# Patient Record
Sex: Female | Born: 1965
Health system: Southern US, Community
[De-identification: ages and names within clinical notes are randomized; demographics above are authoritative.]

## PROBLEM LIST (undated history)

## (undated) DIAGNOSIS — K224 Dyskinesia of esophagus: Secondary | ICD-10-CM

## (undated) DIAGNOSIS — K279 Peptic ulcer, site unspecified, unspecified as acute or chronic, without hemorrhage or perforation: Secondary | ICD-10-CM

## (undated) DIAGNOSIS — K449 Diaphragmatic hernia without obstruction or gangrene: Secondary | ICD-10-CM

## (undated) DIAGNOSIS — F329 Major depressive disorder, single episode, unspecified: Secondary | ICD-10-CM

## (undated) DIAGNOSIS — F419 Anxiety disorder, unspecified: Secondary | ICD-10-CM

## (undated) DIAGNOSIS — K222 Esophageal obstruction: Secondary | ICD-10-CM

## (undated) DIAGNOSIS — K644 Residual hemorrhoidal skin tags: Secondary | ICD-10-CM

## (undated) DIAGNOSIS — B009 Herpesviral infection, unspecified: Secondary | ICD-10-CM

## (undated) DIAGNOSIS — Z765 Malingerer [conscious simulation]: Secondary | ICD-10-CM

## (undated) DIAGNOSIS — F32A Depression, unspecified: Secondary | ICD-10-CM

## (undated) DIAGNOSIS — K922 Gastrointestinal hemorrhage, unspecified: Secondary | ICD-10-CM

## (undated) DIAGNOSIS — I10 Essential (primary) hypertension: Secondary | ICD-10-CM

## (undated) HISTORY — DX: Dyskinesia of esophagus: K22.4

## (undated) HISTORY — DX: Peptic ulcer, site unspecified, unspecified as acute or chronic, without hemorrhage or perforation: K27.9

## (undated) HISTORY — DX: Herpesviral infection, unspecified: B00.9

## (undated) HISTORY — DX: Diaphragmatic hernia without obstruction or gangrene: K44.9

## (undated) HISTORY — PX: COLONOSCOPY: SHX174

## (undated) HISTORY — PX: DILATION AND CURETTAGE OF UTERUS: SHX78

## (undated) HISTORY — PX: UPPER GASTROINTESTINAL ENDOSCOPY: SHX188

## (undated) HISTORY — DX: Gastrointestinal hemorrhage, unspecified: K92.2

## (undated) HISTORY — DX: Residual hemorrhoidal skin tags: K64.4

## (undated) HISTORY — DX: Essential (primary) hypertension: I10

## (undated) HISTORY — DX: Esophageal obstruction: K22.2

---

## 2011-08-06 ENCOUNTER — Emergency Department (HOSPITAL_COMMUNITY)
Admission: EM | Admit: 2011-08-06 | Discharge: 2011-08-06 | Disposition: A | Discharge status: Home / Self Care | Payer: 59 | Attending: Emergency Medicine | Admitting: Emergency Medicine

## 2011-08-06 ENCOUNTER — Encounter (HOSPITAL_COMMUNITY): Payer: Self-pay | Admitting: Emergency Medicine

## 2011-08-06 DIAGNOSIS — J3489 Other specified disorders of nose and nasal sinuses: Secondary | ICD-10-CM | POA: Insufficient documentation

## 2011-08-06 DIAGNOSIS — F329 Major depressive disorder, single episode, unspecified: Secondary | ICD-10-CM | POA: Insufficient documentation

## 2011-08-06 DIAGNOSIS — G43909 Migraine, unspecified, not intractable, without status migrainosus: Secondary | ICD-10-CM | POA: Insufficient documentation

## 2011-08-06 DIAGNOSIS — H53149 Visual discomfort, unspecified: Secondary | ICD-10-CM | POA: Insufficient documentation

## 2011-08-06 DIAGNOSIS — F3289 Other specified depressive episodes: Secondary | ICD-10-CM | POA: Insufficient documentation

## 2011-08-06 DIAGNOSIS — R110 Nausea: Secondary | ICD-10-CM | POA: Insufficient documentation

## 2011-08-06 DIAGNOSIS — R5381 Other malaise: Secondary | ICD-10-CM | POA: Insufficient documentation

## 2011-08-06 HISTORY — DX: Major depressive disorder, single episode, unspecified: F32.9

## 2011-08-06 HISTORY — DX: Depression, unspecified: F32.A

## 2011-08-06 MED ORDER — KETOROLAC TROMETHAMINE 60 MG/2ML IM SOLN
60.0000 mg | Freq: Once | INTRAMUSCULAR | Status: AC
  Administered 2011-08-06: 60 mg via INTRAMUSCULAR
  Filled 2011-08-06: qty 2

## 2011-08-06 MED ORDER — BUTORPHANOL TARTRATE 2 MG/ML IJ SOLN
4.0000 mg | Freq: Once | INTRAMUSCULAR | Status: AC
  Administered 2011-08-06: 4 mg via INTRAMUSCULAR
  Filled 2011-08-06: qty 2

## 2011-08-06 MED ORDER — PROMETHAZINE HCL 25 MG/ML IJ SOLN
25.0000 mg | Freq: Once | INTRAMUSCULAR | Status: AC
  Administered 2011-08-06: 25 mg via INTRAMUSCULAR
  Filled 2011-08-06: qty 1

## 2011-08-06 MED ORDER — DEXAMETHASONE SODIUM PHOSPHATE 10 MG/ML IJ SOLN
10.0000 mg | Freq: Once | INTRAMUSCULAR | Status: AC
  Administered 2011-08-06: 10 mg via INTRAMUSCULAR
  Filled 2011-08-06: qty 1

## 2011-08-06 NOTE — ED Notes (Signed)
Alert x4, states that she does have a neurologist in [place].

## 2011-08-06 NOTE — ED Notes (Signed)
Pt. Ambulated to bathroom. After return to room MD at bedside.

## 2011-08-06 NOTE — ED Provider Notes (Signed)
History     CSN: [id number]  Arrival date & time 08/06/11  1506   First MD Initiated Contact with Patient 08/06/11 1655      Chief Complaint  Patient presents with  . Headache    (Consider location/radiation/quality/duration/timing/severity/associated sxs/prior treatment) Patient is a 46 y.o. female presenting with headaches. The history is provided by the patient.  Headache  This is a new problem. The current episode started more than 2 days ago. The problem occurs constantly. The problem has not changed since onset.The headache is associated with nothing. The pain is located in the temporal and bilateral region. The pain is at a severity of 10/10. The pain is moderate. The pain does not radiate. Associated symptoms include malaise/fatigue and nausea. Pertinent negatives include no fever, no chest pressure, no near-syncope, no palpitations, no syncope, no shortness of breath and no vomiting.    Past Medical History  Diagnosis Date  . Migraine   . Depression     History reviewed. No pertinent past surgical history.  No family history on file.  History  Substance Use Topics  . Smoking status: Not on file  . Smokeless tobacco: Not on file  . Alcohol Use: Yes    OB History    Grav Para Term Preterm Abortions TAB SAB Ect Mult Living                  Review of Systems  Constitutional: Positive for malaise/fatigue. Negative for fever and chills.  HENT: Positive for congestion.   Eyes: Positive for photophobia.  Respiratory: Negative for shortness of breath.   Cardiovascular: Negative for chest pain, palpitations, syncope and near-syncope.  Gastrointestinal: Positive for nausea. Negative for vomiting, abdominal pain and diarrhea.  Genitourinary: Negative for dysuria.  Musculoskeletal: Negative for back pain.  Neurological: Positive for headaches. Negative for weakness and numbness.  Hematological: Does not bruise/bleed easily.    Allergies  Compazine  Home  Medications   Current Outpatient Rx  Name Route Sig Dispense Refill  . LAMOTRIGINE 100 MG PO TABS Oral Take 150 mg by mouth 2 (two) times daily.    Marland Kitchen LORAZEPAM 1 MG PO TABS Oral Take 1 mg by mouth every 6 (six) hours as needed.    . SERTRALINE HCL 100 MG PO TABS Oral Take 100 mg by mouth 2 (two) times daily.    Marland Kitchen ZOLPIDEM TARTRATE 10 MG PO TABS Oral Take 10 mg by mouth at bedtime as needed.      BP 136/88  Pulse 88  Temp(Src) 97.6 F (36.4 C) (Oral)  Resp 18  SpO2 99%  Physical Exam  Nursing note and vitals reviewed. Constitutional: She is oriented to person, place, and time. She appears well-developed and well-nourished.  HENT:  Head: Normocephalic and atraumatic.  Mouth/Throat: Oropharynx is clear and moist.  Eyes: Conjunctivae and EOM are normal. Pupils are equal, round, and reactive to light.  Neck: Normal range of motion. Neck supple.  Cardiovascular: Normal rate, regular rhythm and normal heart sounds.   No murmur heard. Pulmonary/Chest: Effort normal and breath sounds normal. She has no wheezes.  Abdominal: Soft. Bowel sounds are normal. There is no tenderness.  Musculoskeletal: Normal range of motion.  Neurological: She is alert and oriented to person, place, and time. No cranial nerve deficit. She exhibits normal muscle tone. Coordination normal.  Skin: Skin is warm. No rash noted.    ED Course  Procedures (including critical care time)  Labs Reviewed - No data to display No results  found.   1. Migraine       MDM    Patient having a typical migraine headache today since Wednesday. Normal home regimen medications not working. Patient stated that Stadol Phenergan and Toradol would be beneficial when she has headaches that do not respond. Medications in ED patient now feeling better. She was also given 10 mg of Decadron IM.        Shelda Jakes, MD 08/06/11 [phone]

## 2011-08-06 NOTE — ED Notes (Signed)
Requesting IM injection for migraine

## 2011-08-06 NOTE — ED Notes (Signed)
Has taken her imitrex, goody powder, use of ice packs without relief for the past 2 days.

## 2011-08-06 NOTE — ED Notes (Signed)
Pt has a migraine headache and states that it is chronic and has imitrex and other things at home but has not worked. She is from [place] and that tordol 60mg , phenegran 50mg  and stadol 4mg  is what helps her and that she needs it IM. Feels achy and has had some emesis today small amount .

## 2011-08-14 ENCOUNTER — Encounter (HOSPITAL_COMMUNITY): Payer: Self-pay | Admitting: *Deleted

## 2011-08-14 ENCOUNTER — Emergency Department (HOSPITAL_COMMUNITY)
Admission: EM | Admit: 2011-08-14 | Discharge: 2011-08-14 | Disposition: A | Discharge status: Home / Self Care | Payer: 59 | Attending: Emergency Medicine | Admitting: Emergency Medicine

## 2011-08-14 DIAGNOSIS — H53149 Visual discomfort, unspecified: Secondary | ICD-10-CM | POA: Insufficient documentation

## 2011-08-14 DIAGNOSIS — R51 Headache: Secondary | ICD-10-CM | POA: Insufficient documentation

## 2011-08-14 DIAGNOSIS — R110 Nausea: Secondary | ICD-10-CM | POA: Insufficient documentation

## 2011-08-14 MED ORDER — IBUPROFEN 800 MG PO TABS
800.0000 mg | ORAL_TABLET | Freq: Once | ORAL | Status: AC
  Administered 2011-08-14: 800 mg via ORAL

## 2011-08-14 MED ORDER — BUTORPHANOL TARTRATE 2 MG/ML IJ SOLN
4.0000 mg | Freq: Once | INTRAMUSCULAR | Status: AC
  Administered 2011-08-14: 4 mg via INTRAMUSCULAR
  Filled 2011-08-14: qty 2

## 2011-08-14 MED ORDER — IBUPROFEN 800 MG PO TABS
ORAL_TABLET | ORAL | Status: AC
Start: 2011-08-14 — End: 2011-08-14
  Administered 2011-08-14: 800 mg via ORAL
  Filled 2011-08-14: qty 1

## 2011-08-14 MED ORDER — KETOROLAC TROMETHAMINE 60 MG/2ML IM SOLN
60.0000 mg | Freq: Once | INTRAMUSCULAR | Status: AC
  Administered 2011-08-14: 60 mg via INTRAMUSCULAR
  Filled 2011-08-14: qty 2

## 2011-08-14 MED ORDER — PROMETHAZINE HCL 25 MG/ML IJ SOLN
25.0000 mg | Freq: Once | INTRAMUSCULAR | Status: AC
  Administered 2011-08-14: 25 mg via INTRAVENOUS
  Filled 2011-08-14: qty 1

## 2011-08-14 NOTE — ED Notes (Signed)
Patient stable upon discharge.  

## 2011-08-14 NOTE — ED Notes (Signed)
Pt states "I see Dr. Daphine Deutscher in [place], my mother kind of got some bad news & I've had the h/a since then, started around noon"

## 2011-08-14 NOTE — ED Provider Notes (Signed)
History     CSN: [id number]  Arrival date & time 08/14/11  1843   First MD Initiated Contact with Patient 08/14/11 1908      Chief Complaint  Patient presents with  . Migraine    (Consider location/radiation/quality/duration/timing/severity/associated sxs/prior treatment) HPI Comments: Patient typical migraine headache that started this morning it got worse throughout the day. Associated with nausea, photophobia and phonophobia. No vomiting, no fever, weakness numbness or tingling. She took Imitrex at home without relief.  Patient is a 46 y.o. female presenting with migraine. The history is provided by the patient.  Migraine This is a new problem. The current episode started 6 to 12 hours ago. The problem occurs constantly. The problem has been gradually worsening. Associated symptoms include headaches. Pertinent negatives include no chest pain, no abdominal pain and no shortness of breath. The symptoms are aggravated by nothing. The symptoms are relieved by nothing. She has tried a cold compress for the symptoms.    Past Medical History  Diagnosis Date  . Depression   . Migraine     History reviewed. No pertinent past surgical history.  No family history on file.  History  Substance Use Topics  . Smoking status: Never Smoker   . Smokeless tobacco: Not on file  . Alcohol Use: Yes     rarely    OB History    Grav Para Term Preterm Abortions TAB SAB Ect Mult Living                  Review of Systems  Constitutional: Negative for fever and activity change.  HENT: Negative for congestion and rhinorrhea.   Eyes: Positive for photophobia.  Respiratory: Negative for cough and shortness of breath.   Cardiovascular: Negative for chest pain.  Gastrointestinal: Positive for nausea. Negative for vomiting and abdominal pain.  Genitourinary: Negative for dysuria and hematuria.  Musculoskeletal: Negative for back pain.  Skin: Negative for rash.  Neurological: Positive for  headaches.    Allergies  Compazine  Home Medications   Current Outpatient Rx  Name Route Sig Dispense Refill  . LAMOTRIGINE 100 MG PO TABS Oral Take 150 mg by mouth 2 (two) times daily.    Marland Kitchen LORAZEPAM 1 MG PO TABS Oral Take 1 mg by mouth every 6 (six) hours as needed.    Marland Kitchen PSEUDOEPHEDRINE-ACETAMINOPHEN 30-500 MG PO TABS Oral Take 1 tablet by mouth every 4 (four) hours as needed.    . SERTRALINE HCL 100 MG PO TABS Oral Take 100 mg by mouth 2 (two) times daily.    Marland Kitchen ZOLPIDEM TARTRATE 10 MG PO TABS Oral Take 10 mg by mouth at bedtime as needed.      BP 130/72  Pulse 80  Temp(Src) 98.2 F (36.8 C) (Oral)  Resp 20  Wt 215 lb (97.523 kg)  SpO2 100%  LMP 07/13/2011  Physical Exam  Constitutional: She is oriented to person, place, and time. She appears well-developed and well-nourished. No distress.  HENT:  Head: Normocephalic and atraumatic.  Mouth/Throat: Oropharynx is clear and moist. No oropharyngeal exudate.  Eyes: EOM are normal. Pupils are equal, round, and reactive to light.  Neck: Normal range of motion. Neck supple.       No meningismus  Cardiovascular: Normal rate, regular rhythm and normal heart sounds.   Pulmonary/Chest: Breath sounds normal. No respiratory distress.  Abdominal: Soft. There is no tenderness. There is no rebound and no guarding.  Musculoskeletal: Normal range of motion. She exhibits no edema and no  tenderness.  Neurological: She is alert and oriented to person, place, and time. No cranial nerve deficit.  Skin: Skin is warm.    ED Course  Procedures (including critical care time)  Labs Reviewed - No data to display No results found.   1. Headache       MDM  Typical migraine headache since this morning. Patient states she usually responds to Stadol, Phenergan Toradol.  Improved after medication.  Will discharge with neurology followup.       Glynn Octave, MD 08/14/11 [phone]

## 2011-09-06 ENCOUNTER — Encounter (HOSPITAL_COMMUNITY): Payer: Self-pay | Admitting: Emergency Medicine

## 2011-09-06 ENCOUNTER — Emergency Department (HOSPITAL_COMMUNITY)
Admission: EM | Admit: 2011-09-06 | Discharge: 2011-09-06 | Discharge status: Against Medical Advice | Payer: 59 | Attending: Emergency Medicine | Admitting: Emergency Medicine

## 2011-09-06 DIAGNOSIS — G43909 Migraine, unspecified, not intractable, without status migrainosus: Secondary | ICD-10-CM | POA: Insufficient documentation

## 2011-09-06 DIAGNOSIS — R51 Headache: Secondary | ICD-10-CM

## 2011-09-06 DIAGNOSIS — Z765 Malingerer [conscious simulation]: Secondary | ICD-10-CM

## 2011-09-06 MED ORDER — DIPHENHYDRAMINE HCL 50 MG/ML IJ SOLN
50.0000 mg | Freq: Once | INTRAMUSCULAR | Status: AC
  Administered 2011-09-06: 50 mg via INTRAMUSCULAR
  Filled 2011-09-06: qty 1

## 2011-09-06 MED ORDER — METOCLOPRAMIDE HCL 5 MG/ML IJ SOLN
10.0000 mg | Freq: Once | INTRAMUSCULAR | Status: AC
  Administered 2011-09-06: 10 mg via INTRAMUSCULAR
  Filled 2011-09-06: qty 2

## 2011-09-06 MED ORDER — KETOROLAC TROMETHAMINE 60 MG/2ML IM SOLN
60.0000 mg | Freq: Once | INTRAMUSCULAR | Status: AC
  Administered 2011-09-06: 60 mg via INTRAMUSCULAR
  Filled 2011-09-06: qty 2

## 2011-09-06 NOTE — ED Provider Notes (Addendum)
History     CSN: [id number]  Arrival date & time 09/06/11  4098   First MD Initiated Contact with Patient 09/06/11 2038      Chief Complaint  Patient presents with  . Migraine    (Consider location/radiation/quality/duration/timing/severity/associated sxs/prior treatment) HPI  This is this patient's third ER visit since January 20 for migraine headache. She states she moved to [place] a few months ago. She relates she was awakened at 2 AM with her usual typical migraine which is in her temples and behind her eyes. She has nausea without vomiting. She states he feels like ice picks constantly turning in her head. She did go to work today. She took Imitrex early in the morning and then again at 10 AM without  improvement. She relates she has a specific cocktail which works which is Stadol, Benadryl, Toradol. When asked where she used to go for treatment she said she would go to [facility] ED because  [facility] not give her her cocktail.  PCP no local  Past Medical History  Diagnosis Date  . Depression   . Migraine     History reviewed. No pertinent past surgical history.  No family history on file.  History  Substance Use Topics  . Smoking status: Never Smoker   . Smokeless tobacco: Not on file  . Alcohol Use: Yes     rarely   employed Lives with mother  OB History    Grav Para Term Preterm Abortions TAB SAB Ect Mult Living                  Review of Systems  All other systems reviewed and are negative.    Allergies  Compazine  Home Medications   Current Outpatient Rx  Name Route Sig Dispense Refill  . LAMOTRIGINE 100 MG PO TABS Oral Take 150 mg by mouth 2 (two) times daily.    Marland Kitchen LORAZEPAM 1 MG PO TABS Oral Take 1 mg by mouth every 6 (six) hours as needed. anxiety    . PSEUDOEPHEDRINE-ACETAMINOPHEN 30-500 MG PO TABS Oral Take 1 tablet by mouth every 4 (four) hours as needed. headache    . SERTRALINE HCL 100 MG PO TABS Oral Take 100 mg by mouth 2  (two) times daily.    . SUMATRIPTAN SUCCINATE 6 MG/0.5ML [place] SOLN Subcutaneous Inject 6 mg into the skin every 2 (two) hours as needed. migraine    . ZOLPIDEM TARTRATE 10 MG PO TABS Oral Take 10 mg by mouth at bedtime.       BP 144/81  Pulse 99  Temp(Src) 98 F (36.7 C) (Oral)  Resp 16  Wt 215 lb (97.523 kg)  SpO2 99%  LMP 07/13/2011  Vital signs normal    Physical Exam  Nursing note and vitals reviewed. Constitutional: She is oriented to person, place, and time. She appears well-developed and well-nourished.  Non-toxic appearance. She does not appear ill. No distress.       Patient in no distress.  HENT:  Head: Normocephalic and atraumatic.  Right Ear: External ear normal.  Left Ear: External ear normal.  Nose: Nose normal. No mucosal edema or rhinorrhea.  Mouth/Throat: Oropharynx is clear and moist and mucous membranes are normal. No dental abscesses or uvula swelling.  Eyes: Conjunctivae and EOM are normal. Pupils are equal, round, and reactive to light.  Neck: Normal range of motion and full passive range of motion without pain. Neck supple.  Cardiovascular: Normal rate, regular rhythm and normal heart sounds.  Exam reveals no gallop and no friction rub.   No murmur heard. Pulmonary/Chest: Effort normal and breath sounds normal. No respiratory distress. She has no wheezes. She has no rhonchi. She has no rales. She exhibits no tenderness and no crepitus.  Abdominal: Soft. Normal appearance and bowel sounds are normal. She exhibits no distension. There is no tenderness. There is no rebound and no guarding.  Musculoskeletal: Normal range of motion. She exhibits no edema and no tenderness.       Moves all extremities well.   Neurological: She is alert and oriented to person, place, and time. She has normal strength. No cranial nerve deficit.  Skin: Skin is warm, dry and intact. No rash noted. No erythema. No pallor.  Psychiatric: She has a normal mood and affect. Her speech is  normal and behavior is normal. Her mood appears not anxious.    ED Course  Procedures (including critical care time)   Reviewed the [facility] controlled substance site shows patient gets Stadol 10 mg per mL nasal spray, she gets 6 filled every month for many months by Dr. Carlton Adam of [facility] in [place], it was last filled on February 11 for 30 day supply. When patient was asked if she ran out of her spray she states that she did when I asked her about the prescription that was filled on the 11th she states her mother must gotten it filled. Patient states she did not use it today, patient also does not want an IV. Patient just wants to get some shots and then go home immediately.    Medications  ketorolac (TORADOL) injection 60 mg (60 mg Intramuscular Given 09/06/11 2221)  diphenhydrAMINE (BENADRYL) injection 50 mg (50 mg Intramuscular Given 09/06/11 2219)  metoCLOPramide (REGLAN) injection 10 mg (10 mg Intramuscular Given 09/06/11 2219)    Diagnoses that have been ruled out:  None  Diagnoses that are still under consideration:  None  Final diagnoses:  Drug-seeking behavior  Headache   Pt left AMA  Devoria Albe, MD, FACEP    MDM          Ward Givens, MD 09/06/11 2313  Ward Givens, MD 09/07/11 1537

## 2011-09-06 NOTE — ED Notes (Signed)
Pt alert, nad, c/o migraine h/a, onset this am, pt states hx of same, resp even unlabored, skin pwd, denies n/v, ambulates to triage, steady gait

## 2011-09-06 NOTE — ED Notes (Signed)
Pt left AMA, did not wait to sign form, pt unsatisfied stating that MD did not give medication requested, pt states pain is not improved, pt did not wait to speak to MD again.

## 2011-09-07 ENCOUNTER — Emergency Department (HOSPITAL_COMMUNITY)
Admission: EM | Admit: 2011-09-07 | Discharge: 2011-09-07 | Disposition: A | Discharge status: Home / Self Care | Payer: 59 | Attending: Emergency Medicine | Admitting: Emergency Medicine

## 2011-09-07 ENCOUNTER — Encounter (HOSPITAL_COMMUNITY): Payer: Self-pay | Admitting: Emergency Medicine

## 2011-09-07 DIAGNOSIS — Z79899 Other long term (current) drug therapy: Secondary | ICD-10-CM | POA: Insufficient documentation

## 2011-09-07 DIAGNOSIS — F1910 Other psychoactive substance abuse, uncomplicated: Secondary | ICD-10-CM | POA: Insufficient documentation

## 2011-09-07 DIAGNOSIS — Z765 Malingerer [conscious simulation]: Secondary | ICD-10-CM

## 2011-09-07 DIAGNOSIS — F3289 Other specified depressive episodes: Secondary | ICD-10-CM | POA: Insufficient documentation

## 2011-09-07 DIAGNOSIS — F329 Major depressive disorder, single episode, unspecified: Secondary | ICD-10-CM | POA: Insufficient documentation

## 2011-09-07 NOTE — ED Notes (Signed)
Seen in ED last night . Pain unresolved. Vomited at 1030am

## 2011-09-07 NOTE — Discharge Instructions (Signed)
You need to take your nasal stadol that you already have for your migraines. I have talked to Dr Daphine Deutscher, he states you can go to his office to get IV therapy or get our usual migraine headache non-narcotic therapy here in our ED.

## 2011-09-07 NOTE — ED Provider Notes (Signed)
History     CSN: [id number]  Arrival date & time 09/07/11  1510   First MD Initiated Contact with Patient 09/07/11 1537      Chief Complaint  Patient presents with  . Migraine    pt reports pain in front of head, nausea x 36hrs    (Consider location/radiation/quality/duration/timing/severity/associated sxs/prior treatment) HPI  I saw patient in the ER last night. This is her first ER visit since January 20th for migraine headache. Last night she refused to take any IV medicine and only wanted to get Stadol 4 mg IM. I gave her alternative medicines, which are our usual migraine cocktails and she ended up leaving AMA. She relates she still has her headache and presents to the ED in her work clothes. She has pain in her temples and her forehead and behind her eyes. She relates she has had nausea and vomiting twice. She denies blurred vision.  Neurologist Dr Carlton Adam, [facility] in [facility]  Past Medical History  Diagnosis Date  . Depression   . Migraine     History reviewed. No pertinent past surgical history.  Family History  Problem Relation Age of Onset  . Hypertension Father     History  Substance Use Topics  . Smoking status: Never Smoker   . Smokeless tobacco: Not on file  . Alcohol Use: Yes     rarely  employed  OB History    Grav Para Term Preterm Abortions TAB SAB Ect Mult Living                  Review of Systems  All other systems reviewed and are negative.    Allergies  Benadryl and Compazine  Home Medications   Current Outpatient Rx  Name Route Sig Dispense Refill  . BUTORPHANOL TARTRATE 10 MG/ML [place] SOLN Nasal Place 1 spray into the nose every 4 (four) hours as needed.    Marland Kitchen LAMOTRIGINE 100 MG PO TABS Oral Take 150 mg by mouth 2 (two) times daily.    Marland Kitchen LORAZEPAM 1 MG PO TABS Oral Take 1 mg by mouth every 6 (six) hours as needed. anxiety    . PSEUDOEPHEDRINE-ACETAMINOPHEN 30-500 MG PO TABS Oral Take 1 tablet by mouth every 4 (four) hours as  needed. headache    . SERTRALINE HCL 100 MG PO TABS Oral Take 100 mg by mouth 2 (two) times daily.    . SUMATRIPTAN SUCCINATE 6 MG/0.5ML [place] SOLN Subcutaneous Inject 6 mg into the skin every 2 (two) hours as needed. migraine    . ZOLPIDEM TARTRATE 10 MG PO TABS Oral Take 10 mg by mouth at bedtime.       BP 139/69  Pulse 106  Temp(Src) 98.7 F (37.1 C) (Oral)  Resp 20  SpO2 100%  LMP 07/13/2011  Vital signs normal    Physical Exam  Nursing note and vitals reviewed. Constitutional: She is oriented to person, place, and time. She appears well-developed and well-nourished.  Non-toxic appearance. She does not appear ill. No distress.  HENT:  Head: Normocephalic and atraumatic.  Right Ear: External ear normal.  Left Ear: External ear normal.  Nose: Nose normal. No mucosal edema or rhinorrhea.  Mouth/Throat: Oropharynx is clear and moist and mucous membranes are normal. No dental abscesses or uvula swelling.  Eyes: Conjunctivae and EOM are normal. Pupils are equal, round, and reactive to light.  Neck: Normal range of motion and full passive range of motion without pain. Neck supple.  Cardiovascular: Normal rate,  regular rhythm and normal heart sounds.  Exam reveals no gallop and no friction rub.   No murmur heard. Pulmonary/Chest: Effort normal and breath sounds normal. No respiratory distress. She has no wheezes. She has no rhonchi. She has no rales. She exhibits no tenderness and no crepitus.  Abdominal: Soft. Normal appearance and bowel sounds are normal. She exhibits no distension. There is no tenderness. There is no rebound and no guarding.  Musculoskeletal: Normal range of motion. She exhibits no edema and no tenderness.       Moves all extremities well.   Neurological: She is alert and oriented to person, place, and time. She has normal strength. No cranial nerve deficit.  Skin: Skin is warm, dry and intact. No rash noted. No erythema. No pallor.  Psychiatric: She has a normal mood  and affect. Her speech is normal and behavior is normal. Her mood appears not anxious.    ED Course  Procedures (including critical care time)  15:49 Dr Daphine Deutscher, [facility] states they have an IV room in his office and he has told his migraine headache patients to not come to the ED but to come to the office to get IV steroid, depacon/keppra, toradol and phenergan. He confirms she received Stadol NS on 2/11 and she should have enough to get through 30 days.  He states to give our usual non-narcotic cocktail for headaches.   I have talked to this patient about my conversation with her neurologist. She is still refusing to have an IV and is still insisting on getting her Stadol IM, although she states she has stadol nasal spray at home. Of note patient has not listed the stadol NS on her prior ED visits, I found it when I looked at the [facility] site. She also told me last night that she didn't have any, then when confronted with it showing she had it filled on 2/11 she stated "Oh, my mother must have gotten it filled".   17:15 Dr Jean Rosenthal on call for Dr Konrad Felix (called by patient), states they do not prescribe narcotics after hours and he is not in the position to override anything I discussed with Dr Daphine Deutscher, he states to advise her to call Dr Daphine Deutscher early in the morning to discuss her treatment plan.    1. Drug-seeking behavior    Plan discharge  Devoria Albe, MD, FACEP  MDM          Ward Givens, MD 09/07/11 [phone]

## 2011-10-16 ENCOUNTER — Emergency Department (HOSPITAL_COMMUNITY)
Admission: EM | Admit: 2011-10-16 | Discharge: 2011-10-16 | Disposition: A | Discharge status: Home / Self Care | Payer: Self-pay | Attending: Emergency Medicine | Admitting: Emergency Medicine

## 2011-10-16 ENCOUNTER — Encounter (HOSPITAL_COMMUNITY): Payer: Self-pay | Admitting: *Deleted

## 2011-10-16 DIAGNOSIS — F329 Major depressive disorder, single episode, unspecified: Secondary | ICD-10-CM | POA: Insufficient documentation

## 2011-10-16 DIAGNOSIS — F3289 Other specified depressive episodes: Secondary | ICD-10-CM | POA: Insufficient documentation

## 2011-10-16 DIAGNOSIS — M79609 Pain in unspecified limb: Secondary | ICD-10-CM | POA: Insufficient documentation

## 2011-10-16 DIAGNOSIS — M722 Plantar fascial fibromatosis: Secondary | ICD-10-CM | POA: Insufficient documentation

## 2011-10-16 DIAGNOSIS — G43909 Migraine, unspecified, not intractable, without status migrainosus: Secondary | ICD-10-CM | POA: Insufficient documentation

## 2011-10-16 DIAGNOSIS — R110 Nausea: Secondary | ICD-10-CM | POA: Insufficient documentation

## 2011-10-16 DIAGNOSIS — Z79899 Other long term (current) drug therapy: Secondary | ICD-10-CM | POA: Insufficient documentation

## 2011-10-16 MED ORDER — BUTORPHANOL TARTRATE 1 MG/ML IJ SOLN
2.0000 mg | Freq: Once | INTRAMUSCULAR | Status: AC
  Administered 2011-10-16: 2 mg via INTRAMUSCULAR
  Filled 2011-10-16: qty 1

## 2011-10-16 MED ORDER — PROMETHAZINE HCL 25 MG/ML IJ SOLN
25.0000 mg | Freq: Once | INTRAMUSCULAR | Status: AC
  Administered 2011-10-16: 25 mg via INTRAMUSCULAR
  Filled 2011-10-16: qty 1

## 2011-10-16 MED ORDER — KETOROLAC TROMETHAMINE 30 MG/ML IJ SOLN: 30.0000 mg | Freq: Once | INTRAMUSCULAR | Status: DC

## 2011-10-16 MED ORDER — KETOROLAC TROMETHAMINE 30 MG/ML IJ SOLN
30.0000 mg | Freq: Once | INTRAMUSCULAR | Status: AC
  Administered 2011-10-16: 30 mg via INTRAMUSCULAR
  Filled 2011-10-16: qty 1

## 2011-10-16 MED ORDER — PROMETHAZINE HCL 25 MG/ML IJ SOLN
25.0000 mg | Freq: Once | INTRAMUSCULAR | Status: DC
  Filled 2011-10-16: qty 1

## 2011-10-16 NOTE — ED Notes (Signed)
Pt states "the headache started yesterday, I took a couple of Goody's, I'm out of my stadol, I don't get that again until next week, I also have pain in my feet, I worked out and don't know if I over did it"

## 2011-10-16 NOTE — Discharge Instructions (Signed)
Migraine Headache  A migraine is very bad pain on one or both sides of your head. The cause of a migraine is not always known. A migraine can be triggered or caused by different things, such as:   Alcohol.   Smoking.   Stress.   Periods (menstruation) in women.   Aged cheeses.   Foods or drinks that contain nitrates, glutamate, aspartame, or tyramine.   Lack of sleep.   Chocolate.   Caffeine.   Hunger.   Medicines, such as nitroglycerine (used to treat chest pain), birth control pills, estrogen, and some blood pressure medicines.  HOME CARE   Many medicines can help migraine pain or keep migraines from coming back. Your doctor can help you decide on a medicine or treatment program.   If you or your child gets a migraine, it may help to lie down in a dark, quiet room.   Keep a headache journal. This may help find out what is causing the headaches. For example, write down:   What you eat and drink.   How much sleep you get.   Any change to your diet or medicines.  GET HELP RIGHT AWAY IF:    The medicine does not work.   The pain begins again.   The neck is stiff.   You have trouble seeing.   The muscles are weak or you lose muscle control.   You have new symptoms.   You lose your balance.   You have trouble walking.   You feel faint or pass out.  MAKE SURE YOU:    Understand these instructions.   Will watch this condition.   Will get help right away if you are not doing well or get worse.  Document Released: 04/11/2008 Document Revised: 06/22/2011 Document Reviewed: 03/08/2009  [employer] Patient Information 2012 [employer]

## 2011-10-16 NOTE — ED Provider Notes (Signed)
History     CSN: [id number]  Arrival date & time 10/16/11  1447   First MD Initiated Contact with Patient 10/16/11 1728      Chief Complaint  Patient presents with  . Migraine  . Foot Pain    (Consider location/radiation/quality/duration/timing/severity/associated sxs/prior treatment) HPI Comments: Patient has a history of Migraine headaches comes in today with a headache.  She reports that the headache today is no different than her typical migraine.  She has tried taking Goody's Powder for the pain, but has not had relief.  She reports that her migraines are typically triggered by her menstrual cycle and that she is currently having her period.  Patient also reports that she has been having pain of her left plantar fascia.  She reports that she has had this pain for the past 4 days.  Four days ago she worked out for the first time in over a month.  Pain worse with walking.  Patient is a 46 y.o. female presenting with migraine and lower extremity pain. The history is provided by the patient.  Migraine This is a new problem. The current episode started yesterday. The problem occurs constantly. The problem has been gradually worsening. Associated symptoms include headaches and nausea. Pertinent negatives include no chills, fever, neck pain, numbness, rash, visual change, vomiting or weakness.  Foot Pain Associated symptoms include headaches and nausea. Pertinent negatives include no chills, fever, neck pain, numbness, rash, visual change, vomiting or weakness.    Past Medical History  Diagnosis Date  . Depression   . Migraine     History reviewed. No pertinent past surgical history.  Family History  Problem Relation Age of Onset  . Hypertension Father     History  Substance Use Topics  . Smoking status: Never Smoker   . Smokeless tobacco: Not on file  . Alcohol Use: Yes     rarely    OB History    Grav Para Term Preterm Abortions TAB SAB Ect Mult Living                  Review of Systems  Constitutional: Negative for fever and chills.  HENT: Negative for neck pain and neck stiffness.   Gastrointestinal: Positive for nausea. Negative for vomiting.  Skin: Negative for rash.  Neurological: Positive for headaches. Negative for dizziness, syncope, speech difficulty, weakness, light-headedness and numbness.  Psychiatric/Behavioral: Negative for confusion.    Allergies  Benadryl and Compazine  Home Medications   Current Outpatient Rx  Name Route Sig Dispense Refill  . BUTORPHANOL TARTRATE 10 MG/ML NA SOLN Nasal Place 1 spray into the nose every 4 (four) hours as needed. pain    . LAMOTRIGINE 100 MG PO TABS Oral Take 150 mg by mouth 2 (two) times daily. 1 and 1/2 tablet twice daily    . LORAZEPAM 1 MG PO TABS Oral Take 1 mg by mouth every 6 (six) hours as needed. anxiety    . PSEUDOEPHEDRINE-ACETAMINOPHEN 30-500 MG PO TABS Oral Take 1 tablet by mouth every 4 (four) hours as needed. headache    . SERTRALINE HCL 100 MG PO TABS Oral Take 100 mg by mouth 2 (two) times daily.    Marland Kitchen ZOLPIDEM TARTRATE 10 MG PO TABS Oral Take 10 mg by mouth at bedtime.       BP 118/50  Pulse 97  Temp(Src) 99.1 F (37.3 C) (Oral)  Resp 16  Wt 212 lb (96.163 kg)  SpO2 96%  LMP 10/12/2011  Physical Exam  Nursing note and vitals reviewed. Constitutional: She is oriented to person, place, and time. She appears well-developed and well-nourished. No distress.  HENT:  Head: Normocephalic and atraumatic.  Right Ear: External ear normal.  Left Ear: External ear normal.  Eyes: Conjunctivae and EOM are normal. Pupils are equal, round, and reactive to light. Right eye exhibits no discharge. Left eye exhibits no discharge. Right conjunctiva is not injected. Right conjunctiva has no hemorrhage. Left conjunctiva is not injected. Left conjunctiva has no hemorrhage. No scleral icterus. Right eye exhibits no nystagmus. Left eye exhibits no nystagmus.  Neck: Normal range of motion and  full passive range of motion without pain. Neck supple. No spinous process tenderness present. No rigidity.  Cardiovascular: Normal rate, regular rhythm, normal heart sounds and intact distal pulses.   Pulmonary/Chest: Effort normal and breath sounds normal. No respiratory distress.  Musculoskeletal: Normal range of motion.       Tenderness to palpation of the left plantar fascia.    Neurological: She is alert and oriented to person, place, and time. She has normal strength. No cranial nerve deficit or sensory deficit. Coordination and gait normal.  Skin: Skin is warm and dry. No rash noted. She is not diaphoretic.    ED Course  Procedures (including critical care time)  Labs Reviewed - No data to display No results found.   1. Migraine headache    Patient looked up in the [facility] Narcotic Database.  She does not have any recent prescriptions filled for narcotics.   Patient reports that her headache is improving.  MDM  Pt HA treated and improved while in ED.  Presentation is like pts typical HA and non concerning for Operating Room Services, ICH, Meningitis, or temporal arteritis. Pt is afebrile with no focal neuro deficits, nuchal rigidity, or change in vision. Pt is to follow up with her Neurologist to discuss prophylactic medication. Pt verbalizes understanding and is agreeable with plan to dc. Patient was not given Rx for Stadol.  She was told that she would need to see her Neurologist for this.        Pascal Lux [place], PA-C 10/17/11 0006

## 2011-10-17 ENCOUNTER — Emergency Department (HOSPITAL_COMMUNITY)
Admission: EM | Admit: 2011-10-17 | Discharge: 2011-10-17 | Disposition: A | Discharge status: Home / Self Care | Payer: Self-pay | Attending: Emergency Medicine | Admitting: Emergency Medicine

## 2011-10-17 ENCOUNTER — Encounter (HOSPITAL_COMMUNITY): Payer: Self-pay | Admitting: *Deleted

## 2011-10-17 DIAGNOSIS — G43909 Migraine, unspecified, not intractable, without status migrainosus: Secondary | ICD-10-CM | POA: Insufficient documentation

## 2011-10-17 MED ORDER — PROMETHAZINE HCL 25 MG/ML IJ SOLN
25.0000 mg | Freq: Four times a day (QID) | INTRAMUSCULAR | Status: DC | PRN
  Administered 2011-10-17: 25 mg via INTRAMUSCULAR
  Filled 2011-10-17: qty 1

## 2011-10-17 MED ORDER — KETOROLAC TROMETHAMINE 30 MG/ML IJ SOLN
30.0000 mg | Freq: Once | INTRAMUSCULAR | Status: AC
  Administered 2011-10-17: 30 mg via INTRAMUSCULAR
  Filled 2011-10-17: qty 1

## 2011-10-17 MED ORDER — SODIUM CHLORIDE 0.9 % IV SOLN: Freq: Once | INTRAVENOUS | Status: DC

## 2011-10-17 MED ORDER — BUTORPHANOL TARTRATE 1 MG/ML IJ SOLN
4.0000 mg | Freq: Once | INTRAMUSCULAR | Status: AC
  Administered 2011-10-17: 4 mg via INTRAVENOUS
  Filled 2011-10-17: qty 2

## 2011-10-17 NOTE — ED Notes (Signed)
Pt alert and oriented x4. Respirations even and unlabored, bilateral symmetrical rise and fall of chest. Skin warm and dry. In no acute distress. Denies needs.  Denies itching or rash. Ambulatory to discharge window.

## 2011-10-17 NOTE — Discharge Instructions (Signed)
It is extremely important that you followup with your neurologist today via telephone.  Please be sure to call to ensure appropriate ongoing care and appropriate medication use.

## 2011-10-17 NOTE — ED Notes (Signed)
Medications administered. Pt will wait in room for 15 min before being discharged. Pt called family member to give her a ride home.

## 2011-10-17 NOTE — ED Notes (Signed)
Pt alert and oriented x4. Respirations even and unlabored, bilateral symmetrical rise and fall of chest. Skin warm and dry. In no acute distress. Denies needs.   

## 2011-10-17 NOTE — ED Provider Notes (Signed)
History     CSN: [id number]  Arrival date & time 10/17/11  8295   First MD Initiated Contact with Patient 10/17/11 [phone]      Chief Complaint  Patient presents with  . Migraine    (Consider location/radiation/quality/duration/timing/severity/associated sxs/prior treatment) HPI Patient presents with headache.  She notes that this headache began approximately 4 days ago.  The headache has been persistent since that time, in spite of Goody powder.  She has a history of migraines, nose as this headache is characteristically "the same".  Notably, the patient presented to this facility within the past 16 hours for this complaint.  She was treated with IM medications.  She notes that she went home, and her headache persisted.  She slept a few hours, but awoke with continued complaints.  She did not take any additional medication at home.  She presents for additional evaluation of this persistent headache.  She notes no new visual changes, vomiting, ataxia, confusion, or other focal complaints. Past Medical History  Diagnosis Date  . Depression   . Migraine     History reviewed. No pertinent past surgical history.  Family History  Problem Relation Age of Onset  . Hypertension Father     History  Substance Use Topics  . Smoking status: Never Smoker   . Smokeless tobacco: Not on file  . Alcohol Use: Yes     rarely    OB History    Grav Para Term Preterm Abortions TAB SAB Ect Mult Living                  Review of Systems  Constitutional: Negative for fever and chills.  HENT: Negative for neck pain and neck stiffness.   Eyes: Negative.   Respiratory: Negative.   Cardiovascular: Negative.   Gastrointestinal: Positive for nausea. Negative for vomiting.  Genitourinary: Negative.   Musculoskeletal:       Foot pain which the patient notes is better today  Skin: Negative for rash.  Neurological: Positive for headaches. Negative for dizziness, syncope, speech difficulty, weakness,  light-headedness and numbness.  Psychiatric/Behavioral: Negative for confusion.    Allergies  Benadryl and Compazine  Home Medications   Current Outpatient Rx  Name Route Sig Dispense Refill  . BUTORPHANOL TARTRATE 10 MG/ML NA SOLN Nasal Place 1 spray into the nose every 4 (four) hours as needed. pain    . LAMOTRIGINE 100 MG PO TABS Oral Take 150 mg by mouth 2 (two) times daily. 1 and 1/2 tablet twice daily    . LORAZEPAM 1 MG PO TABS Oral Take 1 mg by mouth every 6 (six) hours as needed. anxiety    . PSEUDOEPHEDRINE-ACETAMINOPHEN 30-500 MG PO TABS Oral Take 1 tablet by mouth every 4 (four) hours as needed. headache    . SERTRALINE HCL 100 MG PO TABS Oral Take 100 mg by mouth 2 (two) times daily.    Marland Kitchen ZOLPIDEM TARTRATE 10 MG PO TABS Oral Take 10 mg by mouth at bedtime.       BP 138/90  Pulse 105  Temp(Src) 98.6 F (37 C) (Oral)  Resp 30  Ht 5\' 7"  (1.702 m)  Wt 212 lb (96.163 kg)  BMI 33.20 kg/m2  SpO2 100%  LMP 10/12/2011  Physical Exam  Nursing note and vitals reviewed. Constitutional: She is oriented to person, place, and time. She appears well-developed and well-nourished. No distress.  HENT:  Head: Normocephalic and atraumatic.  Eyes: Conjunctivae and EOM are normal.  Cardiovascular: Normal rate and  regular rhythm.   Pulmonary/Chest: Effort normal and breath sounds normal. No stridor. No respiratory distress.  Abdominal: She exhibits no distension.  Musculoskeletal: She exhibits no edema.  Neurological: She is alert and oriented to person, place, and time. No cranial nerve deficit. She exhibits normal muscle tone. Coordination normal.  Skin: Skin is warm and dry.  Psychiatric: She has a normal mood and affect.    ED Course  Procedures (including critical care time)  Labs Reviewed - No data to display No results found.   No diagnosis found.    MDM  This patient with migraine headaches now presents 12 hours after being discharged with continued headache.  On  my exam the patient is in no distress with no focal neurologic deficits.  The patient's endorsement of this I think characteristically "the same" as innumerable prior headaches it is reassuring.  The absence of abnormal vital signs aside from mild tachycardia and tachypnea on triage (which is not demonstrated on my physical exam) is further reassurance for the absence of acute new pathology.  I discussed, at length, and the need for continued neurology followup, the need for better home medication use for migraine control, and the fact that we would not provide any new narcotic prescriptions for this patient.  She was treated with intramuscular medication and discharged in stable condition.     Gerhard Munch, MD 10/17/11 [phone]

## 2011-10-17 NOTE — ED Provider Notes (Signed)
Medical screening examination/treatment/procedure(s) were performed by non-physician practitioner and as supervising physician I was immediately available for consultation/collaboration.  Hurman Horn, MD 10/17/11 2116

## 2011-10-17 NOTE — ED Notes (Signed)
Pt states she has frequent migraines.pt states she needs all 3 shots at one time. Pt states her headache decreased but is still there. Pt denies any n/v/d

## 2011-11-03 ENCOUNTER — Emergency Department (HOSPITAL_COMMUNITY)
Admission: EM | Admit: 2011-11-03 | Discharge: 2011-11-03 | Disposition: A | Discharge status: Home Health | Payer: Self-pay | Attending: Emergency Medicine | Admitting: Emergency Medicine

## 2011-11-03 ENCOUNTER — Encounter (HOSPITAL_COMMUNITY): Payer: Self-pay | Admitting: Adult Health

## 2011-11-03 DIAGNOSIS — G43909 Migraine, unspecified, not intractable, without status migrainosus: Secondary | ICD-10-CM | POA: Insufficient documentation

## 2011-11-03 DIAGNOSIS — R51 Headache: Secondary | ICD-10-CM

## 2011-11-03 DIAGNOSIS — R110 Nausea: Secondary | ICD-10-CM | POA: Insufficient documentation

## 2011-11-03 MED ORDER — BUTORPHANOL TARTRATE 2 MG/ML IJ SOLN
4.0000 mg | Freq: Once | INTRAMUSCULAR | Status: AC
  Administered 2011-11-03: 4 mg via INTRAMUSCULAR
  Filled 2011-11-03 (×2): qty 1

## 2011-11-03 MED ORDER — METOCLOPRAMIDE HCL 5 MG/ML IJ SOLN
10.0000 mg | Freq: Once | INTRAMUSCULAR | Status: AC
  Administered 2011-11-03: 10 mg via INTRAMUSCULAR
  Filled 2011-11-03: qty 2

## 2011-11-03 MED ORDER — KETOROLAC TROMETHAMINE 60 MG/2ML IM SOLN
60.0000 mg | Freq: Once | INTRAMUSCULAR | Status: AC
  Administered 2011-11-03: 60 mg via INTRAMUSCULAR
  Filled 2011-11-03: qty 2

## 2011-11-03 NOTE — ED Provider Notes (Signed)
History     CSN: [id number]  Arrival date & time 11/03/11  1209   First MD Initiated Contact with Patient 11/03/11 1220      No chief complaint on file.   (Consider location/radiation/quality/duration/timing/severity/associated sxs/prior treatment) HPI Comments: 46 yo female with history of migraine, depression presents to the ER this afternoon for her "usual migraines."  Migraine pain woke the patient yesterday early in the morning from sleep.  She used ice packs and went back to bed, waking in the morning with mild temporal pain and pain behind the eyes.  Pain progressed in severity throughout the day.  Reports sensitivity to sound and light. Patient has tried goody powder and stadol nasal spray, which did not alleviate the symptoms.  Reports nausea, anorexia, no vomiting.  Denies dizziness, vision changes, weakness, blurred vision, ataxia. Denies fever or neck pain.  Patient is a 46 y.o. female presenting with migraine. The history is provided by the patient.  Migraine This is a new problem. The current episode started yesterday. Associated symptoms include headaches. Pertinent negatives include no fatigue, fever, nausea, neck pain, numbness, vomiting or weakness.    Past Medical History  Diagnosis Date  . Depression   . Migraine     History reviewed. No pertinent past surgical history.  Family History  Problem Relation Age of Onset  . Hypertension Father     History  Substance Use Topics  . Smoking status: Never Smoker   . Smokeless tobacco: Not on file  . Alcohol Use: Yes     rarely    OB History    Grav Para Term Preterm Abortions TAB SAB Ect Mult Living                  Review of Systems  Constitutional: Negative for fever and fatigue.  HENT: Negative for facial swelling, neck pain, neck stiffness and tinnitus.   Eyes: Positive for photophobia. Negative for pain and visual disturbance.  Gastrointestinal: Negative for nausea and vomiting.  Neurological:  Positive for headaches. Negative for dizziness, weakness, light-headedness and numbness.  Psychiatric/Behavioral: Negative for confusion.    Allergies  Benadryl and Compazine  Home Medications   Current Outpatient Rx  Name Route Sig Dispense Refill  . BUTORPHANOL TARTRATE 10 MG/ML NA SOLN Nasal Place 1 spray into the nose every 4 (four) hours as needed. pain    . LAMOTRIGINE 100 MG PO TABS Oral Take 150 mg by mouth 2 (two) times daily. 1 and 1/2 tablet twice daily    . LORAZEPAM 1 MG PO TABS Oral Take 1 mg by mouth every 6 (six) hours as needed. anxiety    . PSEUDOEPHEDRINE-ACETAMINOPHEN 30-500 MG PO TABS Oral Take 1 tablet by mouth every 4 (four) hours as needed. headache    . SERTRALINE HCL 100 MG PO TABS Oral Take 100 mg by mouth 2 (two) times daily.    Marland Kitchen ZOLPIDEM TARTRATE 10 MG PO TABS Oral Take 10 mg by mouth at bedtime.       BP 169/85  Pulse 109  Temp(Src) 98.2 F (36.8 C) (Oral)  Resp 16  SpO2 99%  LMP 10/12/2011  Physical Exam  Nursing note and vitals reviewed. Constitutional: She is oriented to person, place, and time. She appears well-developed and well-nourished. No distress.  HENT:  Head: Normocephalic and atraumatic.  Right Ear: External ear normal.  Left Ear: External ear normal.  Nose: Nose normal.  Mouth/Throat: Uvula is midline, oropharynx is clear and moist and mucous membranes are  normal.  Eyes: Conjunctivae are normal. Pupils are equal, round, and reactive to light.  Neck: Normal range of motion. Neck supple.  Cardiovascular: Normal rate, regular rhythm and normal heart sounds.   Pulmonary/Chest: Effort normal and breath sounds normal. No respiratory distress.  Musculoskeletal: She exhibits no edema and no tenderness.  Neurological: She is alert and oriented to person, place, and time. She has normal strength. No cranial nerve deficit or sensory deficit. Coordination and gait normal. GCS eye subscore is 4. GCS verbal subscore is 5. GCS motor subscore is  6.  Skin: Skin is warm and dry.  Psychiatric: She has a normal mood and affect.    ED Course  Procedures (including critical care time)  Labs Reviewed - No data to display No results found.   1. Headache     1:08 PM Patient seen and examined. Medications ordered.   Vital signs reviewed and are as follows: Filed Vitals:   11/03/11 1214  BP: 169/85  Pulse: 109  Temp: 98.2 F (36.8 C)  Resp: 16   Pt urged to return home and rest. Told to follow-up with her neurologist. Renae Gloss to return with worsening or other concerns.    MDM  Typical HA. Patient requesting 'typical' cocktail for HA. Concern for patient requesting narcotics for HA, one dose given in ED. No concern for meningitis, other serious intracranial etiology. Normal neurological exam. Patient appeared well ambulating in hallway without difficulty.         Renne Crigler, [place] 11/03/11 1315

## 2011-11-03 NOTE — ED Notes (Signed)
C/o migraine that began last night took stadol nose spray with no relief, sensitive to light and sound, c/o nausea.

## 2011-11-03 NOTE — Discharge Instructions (Signed)
Please read and follow all provided instructions.  Your diagnoses today include:  1. Headache     Tests performed today include:  Vital signs. See below for your results today.   Medications:  In the Emergency Department you received:  Reglan - antinausea/headache medication  Stadol  Toradol  You have been prescribed:  None  Additional information:  Follow any educational materials contained in this packet.  You are having a headache. No specific cause was found today for your headache. It may have been a migraine or other cause of headache. Stress, anxiety, fatigue, and depression are common triggers for headaches.  Your headache today does not appear to be life-threatening or require hospitalization, but often the exact cause of headaches is not determined in the emergency department. Therefore, follow-up with your doctor is very important to find out what may have caused your headache and whether or not you need any further diagnostic testing or treatment.   Sometimes headaches can appear benign (not harmful), but then more serious symptoms can develop which should prompt an immediate re-evaluation by your doctor or the emergency department.  BE VERY CAREFUL not to take multiple medicines containing Tylenol (also called acetaminophen). Doing so can lead to an overdose which can damage your liver and cause liver failure and possibly death.   Follow-up instructions: Please follow-up with your primary care provider in the next 3 days for further evaluation of your symptoms. If you do not have a primary care doctor -- see below for referral information.   Return instructions:   Please return to the Emergency Department if you experience worsening symptoms.  Return if the medications do not resolve your headache, if it recurs, or if you have multiple episodes of vomiting or cannot keep down fluids.  Return if you have a change from the usual headache.  RETURN IMMEDIATELY IF  you:  Develop a sudden, severe headache  Develop confusion or become poorly responsive or faint  Develop a fever above 100.57F or problem breathing  Have a change in speech, vision, swallowing, or understanding  Develop new weakness, numbness, tingling, incoordination in your arms or legs  Have a seizure  Please return if you have any other emergent concerns.  Additional Information:  Your vital signs today were: BP 169/85  Pulse 109  Temp(Src) 98.2 F (36.8 C) (Oral)  Resp 16  SpO2 99%  LMP 10/12/2011 If your blood pressure (BP) was elevated above 135/85 this visit, please have this repeated by your doctor within one month. -------------- No Primary Care Doctor Call [facility]  [phone] Other agencies that provide inexpensive medical care    [facility]  [phone]    [facility]  [phone]    [facility]  [phone]    [facility]  [phone]    [facility]  [phone]    [facility]  [phone] -------------- RESOURCE GUIDE:  Dental Problems  Patients with Medicaid: [facility] [id number]                                            [phone] Phone:  [phone]  Phone:  [phone]  If unable to pay or uninsured, contact:  [facility] or [facility]. to become qualified for the adult dental clinic.  Chronic Pain Problems Contact [facility]  [phone] Patients need to be referred by their primary care doctor.  Insufficient Money for Medicine Contact [employer]:  call "211" or [facility] [phone].  Psychological Services [facility]  [phone] [facility]  [phone] [facility] Mental [facility]   [phone] (emergency services 9252384582)  Substance Abuse Resources [facility]  (484)343-1425 [facility] [phone] [facility] [place] [phone] Floydene Flock [phone] Residential & Outpatient Substance Abuse Program  325-838-0314  Abuse/Neglect [facility] Child Abuse Hotline 412-610-3200 [facility] Child Abuse Hotline 6156099782 (After Hours)  Emergency Shelter [facility] ([phone]  Maternity Homes Room at the [place] [facility] ([phone] [place] [facility] [phone]  [facility] Resources  [facility] [place]     [employer]                          [facility] Dept. [address] [place]                       [address]      [address]  Blondell Reveal Phone:  [phone]                                   Phone:  [phone]                 Phone:  [phone]  [facility] Mental Health Phone:  [phone]  [facility] Child Abuse Hotline (514)223-1622 ([phone] (After Hours)

## 2011-11-03 NOTE — ED Provider Notes (Signed)
Medical screening examination/treatment/procedure(s) were performed by non-physician practitioner and as supervising physician I was immediately available for consultation/collaboration.   Celene Kras, MD 11/03/11 1324

## 2011-11-17 ENCOUNTER — Encounter (HOSPITAL_COMMUNITY): Payer: Self-pay | Admitting: Emergency Medicine

## 2011-11-17 ENCOUNTER — Emergency Department (HOSPITAL_COMMUNITY)
Admission: EM | Admit: 2011-11-17 | Discharge: 2011-11-17 | Disposition: A | Discharge status: Home / Self Care | Payer: Self-pay | Attending: Emergency Medicine | Admitting: Emergency Medicine

## 2011-11-17 DIAGNOSIS — G43909 Migraine, unspecified, not intractable, without status migrainosus: Secondary | ICD-10-CM | POA: Insufficient documentation

## 2011-11-17 MED ORDER — BUTORPHANOL TARTRATE 2 MG/ML IJ SOLN
4.0000 mg | Freq: Once | INTRAMUSCULAR | Status: AC
  Administered 2011-11-17: 4 mg via INTRAVENOUS

## 2011-11-17 MED ORDER — BUTORPHANOL TARTRATE 2 MG/ML IJ SOLN
2.0000 mg | Freq: Once | INTRAMUSCULAR | Status: DC
  Filled 2011-11-17 (×2): qty 1

## 2011-11-17 MED ORDER — PROMETHAZINE HCL 25 MG/ML IJ SOLN
25.0000 mg | Freq: Once | INTRAMUSCULAR | Status: AC
  Administered 2011-11-17: 25 mg via INTRAMUSCULAR
  Filled 2011-11-17 (×2): qty 1

## 2011-11-17 MED ORDER — KETOROLAC TROMETHAMINE 60 MG/2ML IM SOLN
60.0000 mg | Freq: Once | INTRAMUSCULAR | Status: AC
  Administered 2011-11-17: 60 mg via INTRAMUSCULAR
  Filled 2011-11-17: qty 2

## 2011-11-17 NOTE — ED Provider Notes (Signed)
History     CSN: [id number]  Arrival date & time 11/17/11  1606   First MD Initiated Contact with Patient 11/17/11 1718      Chief Complaint  Patient presents with  . Migraine    (Consider location/radiation/quality/duration/timing/severity/associated sxs/prior treatment) HPI Patient presents to the emergency department for migraine headache.  She states it is typical for her migraines and there is no alteration from her normal symptomatology.  Patient states that she has had some nausea some photophobia.  She denies numbness, weakness, dizziness, difficulty walking or visual changes.  Patient states that she normally takes 3 medications and an IM injection here in the emergency department. Past Medical History  Diagnosis Date  . Depression   . Migraine     History reviewed. No pertinent past surgical history.  Family History  Problem Relation Age of Onset  . Hypertension Father     History  Substance Use Topics  . Smoking status: Never Smoker   . Smokeless tobacco: Not on file  . Alcohol Use: No     rarely    OB History    Grav Para Term Preterm Abortions TAB SAB Ect Mult Living                  Review of Systems All other systems negative except as documented in the HPI. All pertinent positives and negatives as reviewed in the HPI.  Allergies  Benadryl and Compazine  Home Medications   Current Outpatient Rx  Name Route Sig Dispense Refill  . BUTORPHANOL TARTRATE 10 MG/ML NA SOLN Nasal Place 1 spray into the nose every 4 (four) hours as needed. pain    . LAMOTRIGINE 100 MG PO TABS Oral Take 150 mg by mouth 2 (two) times daily. 1 and 1/2 tablet twice daily    . LORAZEPAM 1 MG PO TABS Oral Take 1 mg by mouth every 6 (six) hours as needed. anxiety    . SERTRALINE HCL 100 MG PO TABS Oral Take 100 mg by mouth 2 (two) times daily.    Marland Kitchen ZOLPIDEM TARTRATE 10 MG PO TABS Oral Take 10 mg by mouth at bedtime.       BP 142/90  Pulse 108  Temp(Src) 98.6 F (37 C)  (Oral)  Resp 18  Wt 210 lb (95.255 kg)  SpO2 99%  Physical Exam Physical Examination: General appearance - alert, well appearing, and in no distress and oriented to person, place, and time Mental status - alert, oriented to person, place, and time, normal mood, behavior, speech, dress, motor activity, and thought processes Eyes - pupils equal and reactive, extraocular eye movements intact Ears - bilateral TM's and external ear canals normal Nose - normal and patent, no erythema, discharge or polyps Mouth - mucous membranes moist, pharynx normal without lesions Chest - clear to auscultation, no wheezes, rales or rhonchi, symmetric air entry Heart - normal rate, regular rhythm, normal S1, S2, no murmurs, rubs, clicks or gallops Neurological - alert, oriented, normal speech, no focal findings or movement disorder noted, motor and sensory grossly normal bilaterally, normal muscle tone, no tremors, strength 5/5  ED Course  Procedures (including critical care time)  Patient's previous visits were reviewed.  She is not having focal neurological deficits and is feeling better at this time.  She will be discharged home told to return if any worsening in her condition.  MDM          Carlyle Dolly, PA-C 11/17/11 1920

## 2011-11-17 NOTE — Discharge Instructions (Signed)
Return here as needed.  Follow-up with your neurologist °

## 2011-11-17 NOTE — ED Provider Notes (Signed)
Medical screening examination/treatment/procedure(s) were performed by non-physician practitioner and as supervising physician I was immediately available for consultation/collaboration.  Raeford Razor, MD 11/17/11 [phone]

## 2011-11-19 ENCOUNTER — Emergency Department (HOSPITAL_COMMUNITY)
Admission: EM | Admit: 2011-11-19 | Discharge: 2011-11-19 | Disposition: A | Discharge status: Home / Self Care | Payer: Self-pay | Attending: Emergency Medicine | Admitting: Emergency Medicine

## 2011-11-19 ENCOUNTER — Encounter (HOSPITAL_COMMUNITY): Payer: Self-pay

## 2011-11-19 DIAGNOSIS — R6883 Chills (without fever): Secondary | ICD-10-CM | POA: Insufficient documentation

## 2011-11-19 DIAGNOSIS — G43909 Migraine, unspecified, not intractable, without status migrainosus: Secondary | ICD-10-CM | POA: Insufficient documentation

## 2011-11-19 MED ORDER — KETOROLAC TROMETHAMINE 60 MG/2ML IM SOLN
60.0000 mg | Freq: Once | INTRAMUSCULAR | Status: AC
  Administered 2011-11-19: 60 mg via INTRAMUSCULAR
  Filled 2011-11-19: qty 2

## 2011-11-19 MED ORDER — BUTORPHANOL TARTRATE 2 MG/ML IJ SOLN
4.0000 mg | Freq: Once | INTRAMUSCULAR | Status: DC
  Filled 2011-11-19: qty 2

## 2011-11-19 MED ORDER — PROMETHAZINE HCL 25 MG/ML IJ SOLN
25.0000 mg | Freq: Once | INTRAMUSCULAR | Status: AC
  Administered 2011-11-19: 25 mg via INTRAMUSCULAR
  Filled 2011-11-19: qty 1

## 2011-11-19 MED ORDER — BUTORPHANOL TARTRATE 2 MG/ML IJ SOLN
4.0000 mg | Freq: Once | INTRAMUSCULAR | Status: AC
  Administered 2011-11-19: 4 mg via INTRAMUSCULAR

## 2011-11-19 NOTE — ED Notes (Addendum)
Pt states that she was seen in ED on Friday for migraine. Given medicine to relieve the migraine. Ran/walk in the cancer cure race yesterday and got sick, head spinning, and got progressively worse. Pt stated that "her head felt like it was going to explode". Pt experiencing sensitivity to light, smells, and noises, pt states that everything is maginified.

## 2011-11-19 NOTE — ED Provider Notes (Signed)
Medical screening examination/treatment/procedure(s) were performed by non-physician practitioner and as supervising physician I was immediately available for consultation/collaboration.   Celene Kras, MD 11/19/11 1340

## 2011-11-19 NOTE — ED Provider Notes (Signed)
History     CSN: [id number]  Arrival date & time 11/19/11  1205   First MD Initiated Contact with Patient 11/19/11 1209      Chief Complaint  Patient presents with  . Migraine    (Consider location/radiation/quality/duration/timing/severity/associated sxs/prior treatment) HPI Comments: 46 yo female with a history of migraines and depression presents today for migraine since Saturday afternoon.  Reports "usual type migraine" that has not been relieved by goody powder, muscle relaxants, ice packs, sleep.  Has "run out of my stadol and I'll get it again next week."  Reports sensitivities to sound and light, nausea. Denies weakness, dizziness, numbness, vision changes.  Was seen Friday for migraine.  Requesting usual 3 medication headache cocktail including stadol, Toradol, and phenergan. Denies neck stiffness or pain. No fever.   Patient is a 46 y.o. female presenting with migraine. The history is provided by the patient.  Migraine This is a recurrent problem. The current episode started yesterday. The problem has been gradually worsening. Associated symptoms include chills, headaches and nausea. Pertinent negatives include no abdominal pain, fatigue, fever, neck pain, numbness, vomiting or weakness. Exacerbated by: light, sound. Treatments tried: Goody powder. The treatment provided mild relief.    Past Medical History  Diagnosis Date  . Depression   . Migraine     No past surgical history on file.  Family History  Problem Relation Age of Onset  . Hypertension Father     History  Substance Use Topics  . Smoking status: Never Smoker   . Smokeless tobacco: Not on file  . Alcohol Use: No     rarely    OB History    Grav Para Term Preterm Abortions TAB SAB Ect Mult Living                  Review of Systems  Constitutional: Positive for chills. Negative for fever and fatigue.  HENT: Negative for rhinorrhea, neck pain, neck stiffness and sinus pressure.   Eyes: Negative for  visual disturbance.  Gastrointestinal: Positive for nausea. Negative for vomiting, abdominal pain and diarrhea.  Neurological: Positive for headaches. Negative for dizziness, syncope, facial asymmetry, speech difficulty, weakness and numbness.    Allergies  Benadryl and Compazine  Home Medications   Current Outpatient Rx  Name Route Sig Dispense Refill  . BUTORPHANOL TARTRATE 10 MG/ML NA SOLN Nasal Place 1 spray into the nose every 4 (four) hours as needed. pain    . LAMOTRIGINE 100 MG PO TABS Oral Take 150 mg by mouth 2 (two) times daily. 1 and 1/2 tablet twice daily    . LORAZEPAM 1 MG PO TABS Oral Take 1 mg by mouth every 6 (six) hours as needed. anxiety    . SERTRALINE HCL 100 MG PO TABS Oral Take 100 mg by mouth 2 (two) times daily.    Marland Kitchen ZOLPIDEM TARTRATE 10 MG PO TABS Oral Take 10 mg by mouth at bedtime.       There were no vitals taken for this visit.  Physical Exam  Nursing note and vitals reviewed. Constitutional: She appears well-developed and well-nourished. No distress.  HENT:  Head: Normocephalic and atraumatic.  Right Ear: Tympanic membrane, external ear and ear canal normal.  Left Ear: Tympanic membrane, external ear and ear canal normal.  Nose: Right sinus exhibits no maxillary sinus tenderness and no frontal sinus tenderness. Left sinus exhibits no maxillary sinus tenderness and no frontal sinus tenderness.  Mouth/Throat: Uvula is midline, oropharynx is clear and moist and  mucous membranes are normal. No dental abscesses.  Eyes: Conjunctivae are normal. Pupils are equal, round, and reactive to light. Right eye exhibits no discharge. Left eye exhibits no discharge.  Neck: Normal range of motion. Neck supple.  Cardiovascular: Normal rate, regular rhythm and normal heart sounds.   Pulmonary/Chest: Effort normal and breath sounds normal.  Abdominal: Soft. There is no tenderness.  Musculoskeletal:       No tenderness upon palpation of the cervical vertebrae, neck,  shoulders.  Neurological: She is alert. She has normal strength. No cranial nerve deficit or sensory deficit. Coordination and gait normal.       5/5 strength in bilateral extremities.  Skin: Skin is warm and dry.  Psychiatric: She has a normal mood and affect.    ED Course  Procedures (including critical care time)  Labs Reviewed - No data to display No results found.   1. Migraine     12:58 PM Patient seen and examined. Medications ordered.   Vital signs reviewed and are as follows: Filed Vitals:   11/19/11 1221  BP: 136/99  Pulse: 128  Temp: 99.1 F (37.3 C)  Resp: 16   Urged follow-up with neurologist. Renae Gloss return with worsening. She verbalizes understanding and agrees with plan.    MDM  Pt with h/o migraines, typical symptoms. No concern for meningitis, head injury. Patient appears well. Normal neurological exam.         Renne Crigler, PA 11/19/11 1335

## 2011-12-10 ENCOUNTER — Encounter (HOSPITAL_COMMUNITY): Payer: Self-pay | Admitting: *Deleted

## 2011-12-10 ENCOUNTER — Emergency Department (HOSPITAL_COMMUNITY)
Admission: EM | Admit: 2011-12-10 | Discharge: 2011-12-10 | Disposition: A | Discharge status: Home / Self Care | Payer: Self-pay | Attending: Emergency Medicine | Admitting: Emergency Medicine

## 2011-12-10 DIAGNOSIS — R000 Tachycardia, unspecified: Secondary | ICD-10-CM | POA: Insufficient documentation

## 2011-12-10 DIAGNOSIS — G43909 Migraine, unspecified, not intractable, without status migrainosus: Secondary | ICD-10-CM | POA: Insufficient documentation

## 2011-12-10 MED ORDER — BUTORPHANOL TARTRATE 2 MG/ML IJ SOLN
4.0000 mg | Freq: Once | INTRAMUSCULAR | Status: AC
  Administered 2011-12-10: 4 mg via INTRAMUSCULAR
  Filled 2011-12-10: qty 2

## 2011-12-10 MED ORDER — KETOROLAC TROMETHAMINE 60 MG/2ML IM SOLN
60.0000 mg | Freq: Once | INTRAMUSCULAR | Status: AC
Start: 2011-12-10 — End: 2011-12-10
  Administered 2011-12-10: 60 mg via INTRAMUSCULAR
  Filled 2011-12-10: qty 2

## 2011-12-10 MED ORDER — PROMETHAZINE HCL 25 MG/ML IJ SOLN
25.0000 mg | INTRAMUSCULAR | Status: AC
  Administered 2011-12-10: 25 mg via INTRAMUSCULAR
  Filled 2011-12-10: qty 1

## 2011-12-10 NOTE — ED Provider Notes (Signed)
History     CSN: [id number]  Arrival date & time 12/10/11  1531   First MD Initiated Contact with Patient 12/10/11 1644      Chief Complaint  Patient presents with  . Migraine  . Photophobia  . Nausea    (Consider location/radiation/quality/duration/timing/severity/associated sxs/prior treatment) HPI  46 year old female with history of migraine presents with chief complaint of headache. Patient reports she is having her usual migraine.  Onset gradual, persistent and unrelieved with home treatment.  Headache started since last night.  Described as pain to the side of head behind the left eye. Endorse light and sound sensitivities. She is nauseated without vomiting or diarrhea. Denies fever, neck stiffness, chest pain, shortness of breath, numbness, weakness. Denies any precipitating factors. Patient has multiple prior visits to ER for same.  Sts she has been seen by her neurologist, Dr. Daphine Deutscher who recommend trying botox as possible future treatment.    Past Medical History  Diagnosis Date  . Depression   . Migraine     History reviewed. No pertinent past surgical history.  Family History  Problem Relation Age of Onset  . Hypertension Father   . Cancer Mother     History  Substance Use Topics  . Smoking status: Never Smoker   . Smokeless tobacco: Never Used  . Alcohol Use: Yes     rarely    OB History    Grav Para Term Preterm Abortions TAB SAB Ect Mult Living                  Review of Systems  All other systems reviewed and are negative.    Allergies  Benadryl and Compazine  Home Medications   Current Outpatient Rx  Name Route Sig Dispense Refill  . BUTORPHANOL TARTRATE 10 MG/ML NA SOLN Nasal Place 1 spray into the nose every 4 (four) hours as needed. pain    . LAMOTRIGINE 100 MG PO TABS Oral Take 150 mg by mouth 2 (two) times daily.     Marland Kitchen LORAZEPAM 1 MG PO TABS Oral Take 1 mg by mouth every 6 (six) hours as needed. anxiety    . PSEUDOEPHEDRINE HCL 30  MG PO TABS Oral Take 30 mg by mouth every 4 (four) hours as needed. SINUS    . SERTRALINE HCL 100 MG PO TABS Oral Take 100 mg by mouth 2 (two) times daily.    Marland Kitchen ZOLPIDEM TARTRATE 10 MG PO TABS Oral Take 10 mg by mouth at bedtime.       BP 148/82  Pulse 116  Temp(Src) 98.8 F (37.1 C) (Oral)  Resp 18  Ht 5\' 7"  (1.702 m)  Wt 209 lb 10.5 oz (95.1 kg)  BMI 32.84 kg/m2  SpO2 99%  LMP 11/13/2011  Physical Exam  Nursing note and vitals reviewed. Constitutional: She is oriented to person, place, and time. She appears well-developed and well-nourished. No distress.  HENT:  Head: Normocephalic and atraumatic.  Eyes: Conjunctivae and EOM are normal. Pupils are equal, round, and reactive to light.  Neck: Normal range of motion. Neck supple. No Brudzinski's sign and no Kernig's sign noted.  Cardiovascular: Tachycardia present.   Pulmonary/Chest: Effort normal. No respiratory distress. She has no wheezes.  Abdominal: Soft. There is no tenderness.  Musculoskeletal: Normal range of motion.  Lymphadenopathy:    She has no cervical adenopathy.  Neurological: She is alert and oriented to person, place, and time.  Skin: Skin is warm. No rash noted.  Psychiatric: She has a  normal mood and affect.    ED Course  Procedures (including critical care time)  Labs Reviewed - No data to display No results found.   No diagnosis found.    MDM  Migraine headache, similar to past.  No focal neuro deficits, no menigismal sign.  Is A&O, nontoxic.  Will treat with Stadol/Toradol/phenergan.  Patient is mildly tachycardic, secondary to pain. Patient states is normal for her. She is afebrile.   5:41 PM Pt felt better after treatment.  Will d/c.  Recommendation to f/u with neurologist, Dr. Daphine Deutscher.  Pt voice understanding and agrees.   Fayrene Helper, PA-C 12/10/11 1742

## 2011-12-10 NOTE — Discharge Instructions (Signed)
Migraine Headache  A migraine is very bad pain on one or both sides of your head. The cause of a migraine is not always known. A migraine can be triggered or caused by different things, such as:   Alcohol.   Smoking.   Stress.   Periods (menstruation) in women.   Aged cheeses.   Foods or drinks that contain nitrates, glutamate, aspartame, or tyramine.   Lack of sleep.   Chocolate.   Caffeine.   Hunger.   Medicines, such as nitroglycerine (used to treat chest pain), birth control pills, estrogen, and some blood pressure medicines.  HOME CARE   Many medicines can help migraine pain or keep migraines from coming back. Your doctor can help you decide on a medicine or treatment program.   If you or your child gets a migraine, it may help to lie down in a dark, quiet room.   Keep a headache journal. This may help find out what is causing the headaches. For example, write down:   What you eat and drink.   How much sleep you get.   Any change to your diet or medicines.  GET HELP RIGHT AWAY IF:    The medicine does not work.   The pain begins again.   The neck is stiff.   You have trouble seeing.   The muscles are weak or you lose muscle control.   You have new symptoms.   You lose your balance.   You have trouble walking.   You feel faint or pass out.  MAKE SURE YOU:    Understand these instructions.   Will watch this condition.   Will get help right away if you are not doing well or get worse.  Document Released: 04/11/2008 Document Revised: 06/22/2011 Document Reviewed: 03/08/2009  [employer] Patient Information 2012 [employer]

## 2011-12-10 NOTE — ED Notes (Signed)
Pt from home with reports of pain on left side of head and behind left eye, reports that pain is typical for migraines which she has been diagnosed with. Pt also endorses photophobia and nausea.

## 2011-12-16 NOTE — ED Provider Notes (Signed)
Medical screening examination/treatment/procedure(s) were performed by non-physician practitioner and as supervising physician I was immediately available for consultation/collaboration.  Raeford Razor, MD 12/16/11 [phone]

## 2012-01-06 ENCOUNTER — Emergency Department (HOSPITAL_COMMUNITY)
Admission: EM | Admit: 2012-01-06 | Discharge: 2012-01-06 | Disposition: A | Discharge status: Home / Self Care | Payer: Self-pay | Attending: Emergency Medicine | Admitting: Emergency Medicine

## 2012-01-06 ENCOUNTER — Encounter (HOSPITAL_COMMUNITY): Payer: Self-pay | Admitting: *Deleted

## 2012-01-06 DIAGNOSIS — G43709 Chronic migraine without aura, not intractable, without status migrainosus: Secondary | ICD-10-CM

## 2012-01-06 DIAGNOSIS — G43909 Migraine, unspecified, not intractable, without status migrainosus: Secondary | ICD-10-CM | POA: Insufficient documentation

## 2012-01-06 MED ORDER — PROMETHAZINE HCL 25 MG/ML IJ SOLN
25.0000 mg | Freq: Once | INTRAMUSCULAR | Status: AC
  Administered 2012-01-06: 25 mg via INTRAMUSCULAR
  Filled 2012-01-06: qty 1

## 2012-01-06 MED ORDER — KETOROLAC TROMETHAMINE 60 MG/2ML IM SOLN
60.0000 mg | Freq: Once | INTRAMUSCULAR | Status: AC
  Administered 2012-01-06: 60 mg via INTRAMUSCULAR
  Filled 2012-01-06: qty 2

## 2012-01-06 MED ORDER — BUTORPHANOL TARTRATE 1 MG/ML IJ SOLN
1.0000 mg | Freq: Once | INTRAMUSCULAR | Status: AC
  Administered 2012-01-06: 1 mg via INTRAMUSCULAR
  Filled 2012-01-06: qty 1

## 2012-01-06 NOTE — ED Provider Notes (Signed)
History     CSN: [id number]  Arrival date & time 01/06/12  2002   First MD Initiated Contact with Patient 01/06/12 2152     10:38 PM HPI  Patient is a 46 y.o. female presenting with migraine. The history is provided by the patient.  Migraine This is a recurrent problem. Episode onset: 2 days ago. The problem occurs constantly. The problem has been gradually worsening. Associated symptoms include headaches and nausea. Pertinent negatives include no chills, congestion, coughing, fever, neck pain, numbness, sore throat, vomiting or weakness. Exacerbated by: Bright light. She has tried NSAIDs, sleep and rest for the symptoms. The treatment provided mild relief.    Past Medical History  Diagnosis Date  . Depression   . Migraine     No past surgical history on file.  Family History  Problem Relation Age of Onset  . Hypertension Father   . Cancer Mother     History  Substance Use Topics  . Smoking status: Never Smoker   . Smokeless tobacco: Never Used  . Alcohol Use: Yes     rarely    OB History    Grav Para Term Preterm Abortions TAB SAB Ect Mult Living                  Review of Systems  Constitutional: Negative for fever and chills.  HENT: Negative for congestion, sore throat, rhinorrhea, trouble swallowing, neck pain, neck stiffness, postnasal drip and sinus pressure.   Respiratory: Negative for cough and shortness of breath.   Cardiovascular: Negative for palpitations.  Gastrointestinal: Positive for nausea. Negative for vomiting.  Musculoskeletal: Negative for back pain.  Neurological: Positive for headaches. Negative for dizziness, seizures, speech difficulty, weakness, light-headedness and numbness.  All other systems reviewed and are negative.    Allergies  Benadryl and Compazine  Home Medications   Current Outpatient Rx  Name Route Sig Dispense Refill  . BUTORPHANOL TARTRATE 10 MG/ML NA SOLN Nasal Place 1 spray into the nose every 4 (four) hours as  needed. pain    . LAMOTRIGINE 100 MG PO TABS Oral Take 150 mg by mouth 2 (two) times daily.     Marland Kitchen LORAZEPAM 1 MG PO TABS Oral Take 1 mg by mouth every 6 (six) hours as needed. anxiety    . PSEUDOEPHEDRINE HCL 30 MG PO TABS Oral Take 30 mg by mouth every 4 (four) hours as needed. SINUS    . SERTRALINE HCL 100 MG PO TABS Oral Take 100 mg by mouth 2 (two) times daily.    Marland Kitchen ZOLPIDEM TARTRATE 10 MG PO TABS Oral Take 10 mg by mouth at bedtime.       BP 150/83  Pulse 131  Temp 98.8 F (37.1 C) (Oral)  Resp 20  SpO2 99%  Physical Exam  Vitals reviewed. Constitutional: She is oriented to person, place, and time. Vital signs are normal. She appears well-developed and well-nourished.  HENT:  Head: Normocephalic and atraumatic.  Right Ear: External ear normal.  Left Ear: External ear normal.  Nose: Nose normal.  Mouth/Throat: Oropharynx is clear and moist. No oropharyngeal exudate.  Eyes: Conjunctivae and EOM are normal. Pupils are equal, round, and reactive to light. Right eye exhibits no discharge. Left eye exhibits no discharge.  Neck: Normal range of motion. Neck supple.  Cardiovascular: Normal rate, regular rhythm and normal heart sounds.  Exam reveals no friction rub.   No murmur heard. Pulmonary/Chest: Effort normal and breath sounds normal. She has no wheezes. She  has no rhonchi. She has no rales. She exhibits no tenderness.  Musculoskeletal: Normal range of motion.  Lymphadenopathy:    She has no cervical adenopathy.  Neurological: She is alert and oriented to person, place, and time. No cranial nerve deficit (tested cranial nerves III-XII). Coordination (normal finger to nose and normal ambulation.) normal.  Skin: Skin is warm and dry. No rash noted. No erythema. No pallor.    ED Course  Procedures   MDM  Patient has been here several times for same complaints. Reports she Stadol, Toradol, Phenergan which she calls a migraine cocktail. States the only medication that has worked.  Reports increasing visits since January of 2013th. Discussed with patient that I have concerns she is becoming dependent on the Stadol in the emergency department. I have given IM Stadol, Toradol and Phenergan. However I recommend patient discuss her frequent visits with her neurologist. Discussed that narcotic medication relieves it can also cause migraine headaches. Patient voices understanding and is ready for discharge  Edema suspicion for acute headache is patient states his typical migraine that is in the front of her head. Reports pain as throbbing and associated with mild nausea. No midline neck tenderness or fever.       Thomasene Lot, PA-C 01/06/12 2238

## 2012-01-06 NOTE — Discharge Instructions (Signed)
Recurrent Migraine Headache You have a recurrent migraine headache. The caregiver can usually provide good relief for this headache. If this headache is the same as your previous migraine headaches, it is safe to treat you without repeating a complete evaluation.   These headaches usually have at least two of the following problems:   They occur on one side of the head, pulsate, and are severe enough to prevent daily activities.   They are aggravated by daily physical activities.  You may have one or more of the following symptoms:   Nausea (feeling sick to your stomach).   Vomiting.   Pain with exposure to bright lights or loud noises.  Most headache sufferers have a family history of migraines. Your headaches may also be related to alcohol and smoking habits. Too much sleep, too little sleep, mood, and anxiety may also play a part. Changing some of these triggers may help you lower the number and level of pain of the headaches. Headaches may be related to menses (female menstruation). There are numerous medications that can prevent these headaches. Your caregiver can help you with a medication or regimen (procedure to follow). If this has been a chronic (long-term) condition, the use of long-term narcotics is not recommended. Using long-term narcotics can cause recurrent migraines. Narcotics are only a temporary measure only. They are used for the infrequent migraine that fails to respond to all other measures. SEEK MEDICAL CARE IF:    You do not get relief from the medications given to you.   You have a recurrence of pain.   This headache begins to differ from past migraine (for example if it is more severe).  SEEK IMMEDIATE MEDICAL CARE IF:  You have a fever.   You have a stiff neck.   You have vision loss or have changes in vision.   You have problems with feeling lightheaded, become faint, or lose your balance.   You have muscular weakness.   You have loss of muscular  control.   You develop severe symptoms different from your first symptoms.   You start losing your balance or have trouble walking.   You feel faint or pass out.  MAKE SURE YOU:    Understand these instructions.   Will watch your condition.   Will get help right away if you are not doing well or get worse.  Document Released: 03/28/2001 Document Revised: 06/22/2011 Document Reviewed: 02/20/2008 [employer] Patient Information 2012 [employer]. 

## 2012-01-07 ENCOUNTER — Emergency Department (HOSPITAL_COMMUNITY)
Admission: EM | Admit: 2012-01-07 | Discharge: 2012-01-07 | Disposition: A | Discharge status: Home / Self Care | Payer: Self-pay | Attending: Emergency Medicine | Admitting: Emergency Medicine

## 2012-01-07 ENCOUNTER — Encounter (HOSPITAL_COMMUNITY): Payer: Self-pay

## 2012-01-07 DIAGNOSIS — G43909 Migraine, unspecified, not intractable, without status migrainosus: Secondary | ICD-10-CM | POA: Insufficient documentation

## 2012-01-07 MED ORDER — PROMETHAZINE HCL 25 MG/ML IJ SOLN
25.0000 mg | Freq: Four times a day (QID) | INTRAMUSCULAR | Status: DC | PRN
  Administered 2012-01-07: 25 mg via INTRAMUSCULAR
  Filled 2012-01-07: qty 1

## 2012-01-07 MED ORDER — KETOROLAC TROMETHAMINE 60 MG/2ML IM SOLN
60.0000 mg | Freq: Once | INTRAMUSCULAR | Status: AC
  Administered 2012-01-07: 60 mg via INTRAMUSCULAR
  Filled 2012-01-07: qty 2

## 2012-01-07 MED ORDER — BUTORPHANOL TARTRATE 2 MG/ML IJ SOLN
4.0000 mg | Freq: Once | INTRAMUSCULAR | Status: AC
  Administered 2012-01-07: 4 mg via INTRAMUSCULAR
  Filled 2012-01-07 (×2): qty 1

## 2012-01-07 NOTE — ED Provider Notes (Signed)
Medical screening examination/treatment/procedure(s) were performed by non-physician practitioner and as supervising physician I was immediately available for consultation/collaboration.   Lyanne Co, MD 01/07/12 [phone]

## 2012-01-07 NOTE — ED Notes (Signed)
C/o migraine exacerbation x 2 days, no relief from home remedies, nausea, no vomiting.

## 2012-01-07 NOTE — ED Provider Notes (Signed)
History     CSN: [id number]  Arrival date & time 01/07/12  1813   6:45 PM HPI Pt was seen by me yesterday for chronic migraine. Reports her migraine began 3 days ago. States medications given yesterday were incorrect dosage and her headache is still here. Reports no worsening in symptoms just a persistent headache.  Patient is a 46 y.o. female presenting with headaches. The history is provided by the patient.  Headache  This is a new problem. The current episode started more than 2 days ago. The problem occurs constantly. The problem has not changed since onset.The pain is located in the frontal region. The quality of the pain is described as throbbing. The pain is severe. The pain does not radiate. Associated symptoms include nausea and vomiting. Pertinent negatives include no fever, no palpitations and no shortness of breath. The treatment provided no relief.    Past Medical History  Diagnosis Date  . Depression   . Migraine     No past surgical history on file.  Family History  Problem Relation Age of Onset  . Hypertension Father   . Cancer Mother     History  Substance Use Topics  . Smoking status: Never Smoker   . Smokeless tobacco: Never Used  . Alcohol Use: Yes     rarely    OB History    Grav Para Term Preterm Abortions TAB SAB Ect Mult Living                  Review of Systems  Constitutional: Negative for fever and chills.  HENT: Negative for congestion, sore throat, rhinorrhea, trouble swallowing, neck pain, neck stiffness, postnasal drip and sinus pressure.   Respiratory: Negative for cough and shortness of breath.   Cardiovascular: Negative for palpitations.  Gastrointestinal: Positive for nausea and vomiting.  Musculoskeletal: Negative for back pain.  Neurological: Positive for headaches. Negative for dizziness, seizures, speech difficulty, weakness, light-headedness and numbness.  All other systems reviewed and are negative.    Allergies  Benadryl  and Compazine  Home Medications   Current Outpatient Rx  Name Route Sig Dispense Refill  . BUTORPHANOL TARTRATE 10 MG/ML NA SOLN Nasal Place 1 spray into the nose every 4 (four) hours as needed. pain    . LAMOTRIGINE 100 MG PO TABS Oral Take 150 mg by mouth 2 (two) times daily.     Marland Kitchen LORAZEPAM 1 MG PO TABS Oral Take 1 mg by mouth every 6 (six) hours as needed. anxiety    . PSEUDOEPHEDRINE HCL 30 MG PO TABS Oral Take 30 mg by mouth every 4 (four) hours as needed. SINUS    . SERTRALINE HCL 100 MG PO TABS Oral Take 100 mg by mouth 2 (two) times daily.    Marland Kitchen ZOLPIDEM TARTRATE 10 MG PO TABS Oral Take 10 mg by mouth at bedtime.       BP 135/86  Pulse 105  Temp 99 F (37.2 C) (Oral)  Resp 18  SpO2 100%  Physical Exam  Vitals reviewed. Constitutional: She is oriented to person, place, and time. Vital signs are normal. She appears well-developed and well-nourished.  HENT:  Head: Normocephalic and atraumatic.  Right Ear: External ear normal.  Left Ear: External ear normal.  Nose: Nose normal.  Mouth/Throat: Oropharynx is clear and moist. No oropharyngeal exudate.  Eyes: Conjunctivae and EOM are normal. Pupils are equal, round, and reactive to light. Right eye exhibits no discharge. Left eye exhibits no discharge.  Neck: Normal  range of motion. Neck supple. No spinous process tenderness and no muscular tenderness present. No rigidity. Normal range of motion present.  Cardiovascular: Normal rate, regular rhythm and normal heart sounds.  Exam reveals no friction rub.   No murmur heard. Pulmonary/Chest: Effort normal and breath sounds normal. She has no wheezes. She has no rhonchi. She has no rales. She exhibits no tenderness.  Musculoskeletal: Normal range of motion.  Lymphadenopathy:    She has no cervical adenopathy.  Neurological: She is alert and oriented to person, place, and time. No cranial nerve deficit (tested CN III-XII). Coordination (Normal gait and finger to nose) normal.  Skin:  Skin is warm and dry. No rash noted. No erythema. No pallor.    ED Course  Procedures  MDM  Pt has received her medication and request discharge so she may sleep in her own bed. Once again I advised patient to f/u with her neurologist. Pt voices understanding and is ready for d/c       Thomasene Lot, PA-C 01/07/12 1906

## 2012-01-07 NOTE — ED Provider Notes (Signed)
Medical screening examination/treatment/procedure(s) were performed by non-physician practitioner and as supervising physician I was immediately available for consultation/collaboration.  Flint Melter, MD 01/07/12 0010

## 2012-01-14 ENCOUNTER — Encounter (HOSPITAL_COMMUNITY): Payer: Self-pay | Admitting: Emergency Medicine

## 2012-01-14 ENCOUNTER — Emergency Department (HOSPITAL_COMMUNITY)
Admission: EM | Admit: 2012-01-14 | Discharge: 2012-01-14 | Disposition: A | Discharge status: Home / Self Care | Payer: Self-pay | Attending: Emergency Medicine | Admitting: Emergency Medicine

## 2012-01-14 DIAGNOSIS — R110 Nausea: Secondary | ICD-10-CM | POA: Insufficient documentation

## 2012-01-14 DIAGNOSIS — R109 Unspecified abdominal pain: Secondary | ICD-10-CM

## 2012-01-14 DIAGNOSIS — N949 Unspecified condition associated with female genital organs and menstrual cycle: Secondary | ICD-10-CM | POA: Insufficient documentation

## 2012-01-14 DIAGNOSIS — Z79899 Other long term (current) drug therapy: Secondary | ICD-10-CM | POA: Insufficient documentation

## 2012-01-14 DIAGNOSIS — F329 Major depressive disorder, single episode, unspecified: Secondary | ICD-10-CM | POA: Insufficient documentation

## 2012-01-14 DIAGNOSIS — F3289 Other specified depressive episodes: Secondary | ICD-10-CM | POA: Insufficient documentation

## 2012-01-14 DIAGNOSIS — H53149 Visual discomfort, unspecified: Secondary | ICD-10-CM | POA: Insufficient documentation

## 2012-01-14 DIAGNOSIS — R51 Headache: Secondary | ICD-10-CM | POA: Insufficient documentation

## 2012-01-14 DIAGNOSIS — R1013 Epigastric pain: Secondary | ICD-10-CM | POA: Insufficient documentation

## 2012-01-14 LAB — CBC WITH DIFFERENTIAL/PLATELET
Basophils Absolute: 0.1 10*3/uL (ref 0.0–0.1)
Basophils Relative: 0 % (ref 0–1)
Eosinophils Absolute: 0.4 10*3/uL (ref 0.0–0.7)
Eosinophils Relative: 3 % (ref 0–5)
HCT: 41.1 % (ref 36.0–46.0)
Hemoglobin: 13.6 g/dL (ref 12.0–15.0)
Lymphocytes Relative: 29 % (ref 12–46)
Lymphs Abs: 3.6 10*3/uL (ref 0.7–4.0)
MCH: 28.1 pg (ref 26.0–34.0)
MCHC: 33.1 g/dL (ref 30.0–36.0)
MCV: 84.9 fL (ref 78.0–100.0)
Monocytes Absolute: 1.1 10*3/uL — ABNORMAL HIGH (ref 0.1–1.0)
Monocytes Relative: 9 % (ref 3–12)
Neutro Abs: 7.3 10*3/uL (ref 1.7–7.7)
Neutrophils Relative %: 59 % (ref 43–77)
Platelets: 474 10*3/uL — ABNORMAL HIGH (ref 150–400)
RBC: 4.84 MIL/uL (ref 3.87–5.11)
RDW: 14.8 % (ref 11.5–15.5)
WBC: 12.4 10*3/uL — ABNORMAL HIGH (ref 4.0–10.5)

## 2012-01-14 LAB — URINALYSIS, ROUTINE W REFLEX MICROSCOPIC
Glucose, UA: NEGATIVE mg/dL
Leukocytes, UA: NEGATIVE
Nitrite: NEGATIVE
Protein, ur: 30 mg/dL — AB
Specific Gravity, Urine: >1.046 — ABNORMAL HIGH (ref 1.005–1.030)
Urobilinogen, UA: 0.2 mg/dL (ref 0.0–1.0)
pH: 5.5 (ref 5.0–8.0)

## 2012-01-14 LAB — BASIC METABOLIC PANEL
BUN: 10 mg/dL (ref 6–23)
CO2: 19 mEq/L (ref 19–32)
Calcium: 10.2 mg/dL (ref 8.4–10.5)
Chloride: 102 mEq/L (ref 96–112)
Creatinine, Ser: 0.67 mg/dL (ref 0.50–1.10)
GFR calc Af Amer: >90 mL/min (ref >90)
GFR calc non Af Amer: >90 mL/min (ref >90)
Glucose, Bld: 99 mg/dL (ref 70–99)
Potassium: 3.6 mEq/L (ref 3.5–5.1)
Sodium: 138 mEq/L (ref 135–145)

## 2012-01-14 LAB — URINE MICROSCOPIC-ADD ON

## 2012-01-14 MED ORDER — PANTOPRAZOLE SODIUM 40 MG PO TBEC: 40.0000 mg | DELAYED_RELEASE_TABLET | Freq: Every day | ORAL | Status: DC

## 2012-01-14 MED ORDER — SODIUM CHLORIDE 0.9 % IV BOLUS (SEPSIS)
1000.0000 mL | Freq: Once | INTRAVENOUS | Status: AC
  Administered 2012-01-14: 1000 mL via INTRAVENOUS

## 2012-01-14 MED ORDER — ONDANSETRON HCL 4 MG/2ML IJ SOLN
4.0000 mg | Freq: Once | INTRAMUSCULAR | Status: AC
  Administered 2012-01-14: 4 mg via INTRAVENOUS
  Filled 2012-01-14: qty 2

## 2012-01-14 MED ORDER — HYDROMORPHONE HCL PF 1 MG/ML IJ SOLN
1.0000 mg | Freq: Once | INTRAMUSCULAR | Status: AC
  Administered 2012-01-14: 1 mg via INTRAVENOUS
  Filled 2012-01-14: qty 1

## 2012-01-14 MED ORDER — KETOROLAC TROMETHAMINE 30 MG/ML IJ SOLN
30.0000 mg | Freq: Once | INTRAMUSCULAR | Status: AC
  Administered 2012-01-14: 30 mg via INTRAVENOUS
  Filled 2012-01-14: qty 1

## 2012-01-14 MED ORDER — PROMETHAZINE HCL 25 MG/ML IJ SOLN
25.0000 mg | Freq: Four times a day (QID) | INTRAMUSCULAR | Status: DC | PRN
  Administered 2012-01-14: 25 mg via INTRAVENOUS
  Filled 2012-01-14: qty 1

## 2012-01-14 MED ORDER — BUTORPHANOL TARTRATE 2 MG/ML IJ SOLN
4.0000 mg | Freq: Once | INTRAMUSCULAR | Status: AC
  Administered 2012-01-14: 4 mg via INTRAVENOUS
  Filled 2012-01-14: qty 2

## 2012-01-14 MED ORDER — GI COCKTAIL ~~LOC~~
30.0000 mL | Freq: Once | ORAL | Status: AC
  Administered 2012-01-14: 30 mL via ORAL
  Filled 2012-01-14: qty 30

## 2012-01-14 NOTE — ED Notes (Signed)
Pt states abdominal pain has been persistent for 2-3 weeks and headache for 2-3 days

## 2012-01-14 NOTE — Discharge Instructions (Signed)
Headache, General, Unknown Cause The specific cause of your headache may not have been found today. There are many causes and types of headache. A few common ones are:  Tension headache.   Migraine.   Infections (examples: dental and sinus infections).   Bone and/or joint problems in the neck or jaw.   Depression.   Eye problems.  These headaches are not life threatening.  Headaches can sometimes be diagnosed by a patient history and a physical exam. Sometimes, lab and imaging studies (such as x-ray and/or CT scan) are used to rule out more serious problems. In some cases, a spinal tap (lumbar puncture) may be requested. There are many times when your exam and tests may be normal on the first visit even when there is a serious problem causing your headaches. Because of that, it is very important to follow up with your doctor or local clinic for further evaluation. FINDING OUT THE RESULTS OF TESTS  If a radiology test was performed, a radiologist will review your results.   You will be contacted by the emergency department or your physician if any test results require a change in your treatment plan.   Not all test results may be available during your visit. If your test results are not back during the visit, make an appointment with your caregiver to find out the results. Do not assume everything is normal if you have not heard from your caregiver or the medical facility. It is important for you to follow up on all of your test results.  HOME CARE INSTRUCTIONS   Keep follow-up appointments with your caregiver, or any specialist referral.   Only take over-the-counter or prescription medicines for pain, discomfort, or fever as directed by your caregiver.   Biofeedback, massage, or other relaxation techniques may be helpful.   Ice packs or heat applied to the head and neck can be used. Do this three to four times per day, or as needed.   Call your doctor if you have any questions or  concerns.   If you smoke, you should quit.  SEEK MEDICAL CARE IF:   You develop problems with medications prescribed.   You do not respond to or obtain relief from medications.   You have a change from the usual headache.   You develop nausea or vomiting.  SEEK IMMEDIATE MEDICAL CARE IF:   If your headache becomes severe.   You have an unexplained oral temperature above 102 F (38.9 C), or as your caregiver suggests.   You have a stiff neck.   You have loss of vision.   You have muscular weakness.   You have loss of muscular control.   You develop severe symptoms different from your first symptoms.   You start losing your balance or have trouble walking.   You feel faint or pass out.  MAKE SURE YOU:   Understand these instructions.   Will watch your condition.   Will get help right away if you are not doing well or get worse.  Document Released: 07/03/2005 Document Revised: 06/22/2011 Document Reviewed: 02/20/2008 ExitCare Patient Information 2012 [employer]  RESOURCE GUIDE  Chronic Pain Problems: Contact [facility]  [phone] Patients need to be referred by their primary care doctor.  Insufficient Money for Medicine: Contact United Way:  call "211" or [facility] [phone].  No Primary Care Doctor: - Call [facility]  [phone] - can help you locate a primary care doctor that  accepts your   insurance, provides certain services, etc. - Physician Referral Service- 1-800-533-3463  Agencies that provide inexpensive medical care: - [facility]  [phone] - [facility]  [phone] - [facility]  [phone] - [facility]  [phone] - [facility]  [phone] - [facility]  [phone]  Medicaid-accepting [place] Providers: - [facility]- [address]  [phone], Mon-Fri 9am-7pm, Sat 9am-1pm - [facility]-  [address], [address]  [phone] - [facility]- [address], [address]  [phone] - [facility]- [address] [place] [address]  [phone] - Veita [facility]- [address], [phone]  Only accepts [place] Access Medicaid patients after they have their name  applied to their card  Self Pay (no insurance) in [place]: - Sickle Cell Patients: Dr Eric Dean, [facility]  [address], [phone] - [facility]- [address]  [phone]       -      [facility]- [address]       -     [facility]- see information above (Speak to Pam H if you do not have insurance)       -  [facility]- [address], [phone]       -  [facility] [place]- [address],  [phone]       -  [facility]- [address] [place] [address], [phone]       -  Dr Osei-Bonsu-  [address], [place], [phone]       -  [facility]- [address], [phone]       -  [facility]- [address] [place] [address], [phone], also [address], [phone]       -    [facility]- [address], [phone], 1st & 3rd Saturday   every month, 10am-1pm  1) Find a Doctor and Pay Out of Pocket Although you won't have to find out who is covered by your insurance plan, it is a good idea to ask around and get recommendations. You will then need to call the office and see if the doctor you have chosen will accept you as a new patient and what types of options they offer for patients who are self-pay. Some doctors offer discounts or will set up payment plans for their patients who do not have insurance, but you will need to ask so you aren't surprised when you get to your appointment.  2) Contact Your Local Health Department Not all health departments have doctors that can see patients for sick visits, but many do, so it is  worth a call to see if yours does. If you don't know where your local health department is, you can check in your phone book. The CDC also has a tool to help you locate your state's health department, and many state websites also have listings of all of their local health departments.  3) Find a Walk-in Clinic If your illness is not likely to be very severe or complicated, you may want to try a walk in clinic. These are popping up all over the country in pharmacies, drugstores, and shopping centers. They're usually staffed by nurse practitioners or physician assistants that have been trained to   treat common illnesses and complaints. They're usually fairly quick and inexpensive. However, if you have serious medical issues or chronic medical problems, these are probably not your best option  STD Testing - [place] [facility] [place], [facility], [address], [place], phone [phone] or [phone].  Monday - Friday, call for an appointment. - [place] [facility] [place], [facility] Clinic, [address], [place], phone [phone] or [phone].  Monday - Friday, call for an appointment.  Abuse/Neglect: - [place] Child Abuse Hotline (336) 641-3795 - [place] Child Abuse Hotline 800-378-5315 (After Hours)  Emergency Shelter:  [facility] (336) 271-5985  Maternity Homes: - [facility] (336) 275-9566 - [facility] (704) 372-4663  MRSA Hotline #:   832-7006  [place] Resources  [facility] [place]  [employer] [place] [facility] [address]                 [address]         [address]  [place]                                               [place]                              [place] Phone:  [phone]                                  Phone:  [phone]                   Phone:  [phone]  [place] [facility], [phone] - [place] Services - [facility]- 1-888-581-9988       -     [facility] in [place], [address],                                  336-349-4454, Insurance  [place] Child Abuse Hotline (336) 342-1394 or (336) 342-3537 (After Hours)   Behavioral Health Services  Substance Abuse Resources: - [facility]  336-882-2125 - [facility] 336-784-9470 - [facility] 336-285-9073 - [facility] 336-845-3988 - Residential & Outpatient Substance Abuse Program  800-659-3381  Psychological Services: - [facility]  [phone] - [facility]  [phone] - [facility], [address], [place], ACCESS LINE: 1-800-853-5163 or 336-641-4981, Http://www.[facility].com/services/adult.htm  Dental Assistance  If unable to pay or uninsured, contact:  [facility] or [place] [facility] to become qualified for the adult dental clinic.  Patients with Medicaid: [facility] [address], [phone], [phone]  If unable to pay, or uninsured, contact [facility] ([phone]) or [place] [facility] ([phone] in [place], [phone] in [place]) to become qualified for the adult dental clinic  Other Low-Cost Community Dental Services: - [facility]- [address], [place], [place], [address], [phone], Ext. [address], 2nd and 4th Thursday of the month at 6:30am.  10 clients each day by appointment, can   sometimes see walk-in patients if someone does not show for an appointment. - [facility]- [address], [place], [place], [address], [phone] - [facility]- [address], [place], , [address], [phone] - [place] [facility]- [phone] - [facility]- [phone] - [facility]-  [phone]     

## 2012-01-14 NOTE — ED Notes (Signed)
Patient reports that she has had abdominal cramping with vomiting  - noted blood in emesis at times. The patient also reports a typical MHA. The patient reports that this abdominal cramping is worse then her usual menstrual cycle pain

## 2012-01-16 ENCOUNTER — Emergency Department (HOSPITAL_COMMUNITY)
Admission: EM | Admit: 2012-01-16 | Discharge: 2012-01-16 | Disposition: A | Discharge status: Home / Self Care | Payer: Self-pay | Attending: Emergency Medicine | Admitting: Emergency Medicine

## 2012-01-16 ENCOUNTER — Encounter (HOSPITAL_COMMUNITY): Payer: Self-pay | Admitting: *Deleted

## 2012-01-16 DIAGNOSIS — R1013 Epigastric pain: Secondary | ICD-10-CM | POA: Insufficient documentation

## 2012-01-16 DIAGNOSIS — Z79899 Other long term (current) drug therapy: Secondary | ICD-10-CM | POA: Insufficient documentation

## 2012-01-16 DIAGNOSIS — R51 Headache: Secondary | ICD-10-CM | POA: Insufficient documentation

## 2012-01-16 DIAGNOSIS — R109 Unspecified abdominal pain: Secondary | ICD-10-CM

## 2012-01-16 MED ORDER — KETOROLAC TROMETHAMINE 15 MG/ML IJ SOLN
15.0000 mg | Freq: Once | INTRAMUSCULAR | Status: AC
  Administered 2012-01-16: 15 mg via INTRAVENOUS
  Filled 2012-01-16: qty 1

## 2012-01-16 MED ORDER — BUTORPHANOL TARTRATE 2 MG/ML IJ SOLN
4.0000 mg | Freq: Once | INTRAMUSCULAR | Status: AC
  Administered 2012-01-16: 4 mg via INTRAVENOUS
  Filled 2012-01-16: qty 2

## 2012-01-16 MED ORDER — SODIUM CHLORIDE 0.9 % IV BOLUS (SEPSIS)
1000.0000 mL | Freq: Once | INTRAVENOUS | Status: AC
  Administered 2012-01-16: 1000 mL via INTRAVENOUS

## 2012-01-16 MED ORDER — METOCLOPRAMIDE HCL 5 MG/ML IJ SOLN
10.0000 mg | Freq: Once | INTRAMUSCULAR | Status: AC
  Administered 2012-01-16: 10 mg via INTRAVENOUS
  Filled 2012-01-16: qty 2

## 2012-01-16 MED ORDER — GI COCKTAIL ~~LOC~~
30.0000 mL | Freq: Once | ORAL | Status: AC
  Administered 2012-01-16: 30 mL via ORAL
  Filled 2012-01-16: qty 30

## 2012-01-16 NOTE — ED Notes (Signed)
Pt state she was seen on Sunday. Pt states " I think the medication I received on Sunday was effective but the pain has came back." pt states due to the abdominal pain she has not been able to eat and cause her migraines to increase. Pt denies any emesis

## 2012-01-16 NOTE — ED Provider Notes (Signed)
History    46yF with HA and abdominal pain. Just seen by myself a couple days ago for the same. Felt much improved on DC but says pain returned. Similar to previous migraines. No trauma. No fever. No confusion. No neck pain or stiffness. Upper abdominal pain in epigastric region. Prescribed PPI on last visit and reports compliance.  CSN: [id number]  Arrival date & time 01/16/12  1650   First MD Initiated Contact with Patient 01/16/12 1730      Chief Complaint  Patient presents with  . Abdominal Pain  . Migraine    (Consider location/radiation/quality/duration/timing/severity/associated sxs/prior treatment) HPI  Past Medical History  Diagnosis Date  . Depression   . Migraine     History reviewed. No pertinent past surgical history.  Family History  Problem Relation Age of Onset  . Hypertension Father   . Cancer Mother     History  Substance Use Topics  . Smoking status: Never Smoker   . Smokeless tobacco: Never Used  . Alcohol Use: Yes     rarely    OB History    Grav Para Term Preterm Abortions TAB SAB Ect Mult Living                  Review of Systems   Review of symptoms negative unless otherwise noted in HPI.   Allergies  Benadryl and Compazine  Home Medications   Current Outpatient Rx  Name Route Sig Dispense Refill  . BUTORPHANOL TARTRATE 10 MG/ML NA SOLN Nasal Place 1 spray into the nose every 4 (four) hours as needed. pain    . CALCIUM CARBONATE ANTACID 500 MG PO CHEW Oral Chew 1 tablet by mouth daily.    Marland Kitchen LAMOTRIGINE 100 MG PO TABS Oral Take 150 mg by mouth 2 (two) times daily.     Marland Kitchen LORAZEPAM 1 MG PO TABS Oral Take 1 mg by mouth every 6 (six) hours as needed. anxiety    . PANTOPRAZOLE SODIUM 40 MG PO TBEC Oral Take 1 tablet (40 mg total) by mouth daily. 30 tablet 0  . SERTRALINE HCL 100 MG PO TABS Oral Take 100 mg by mouth 2 (two) times daily.    Marland Kitchen ZOLPIDEM TARTRATE 10 MG PO TABS Oral Take 10 mg by mouth at bedtime.       BP 129/70  Pulse  110  Temp 97.9 F (36.6 C) (Oral)  Resp 22  Ht 5\' 6"  (1.676 m)  Wt 205 lb (92.987 kg)  BMI 33.09 kg/m2  SpO2 97%  LMP 01/09/2012  Physical Exam  Nursing note and vitals reviewed. Constitutional: She is oriented to person, place, and time. No distress.       Laying in bed. No acute distress. Obese.  HENT:  Head: Normocephalic and atraumatic.  Eyes: Conjunctivae and EOM are normal. Pupils are equal, round, and reactive to light. Right eye exhibits no discharge. Left eye exhibits no discharge.  Neck: Neck supple.  Cardiovascular: Normal rate, regular rhythm and normal heart sounds.  Exam reveals no gallop and no friction rub.   No murmur heard. Pulmonary/Chest: Effort normal and breath sounds normal. No respiratory distress.  Abdominal: Soft. She exhibits no distension. There is tenderness. There is no rebound.       Mild tenderness in epigastrium without rebound or guarding.  Musculoskeletal: She exhibits no edema and no tenderness.  Neurological: She is alert and oriented to person, place, and time. No cranial nerve deficit. She exhibits normal muscle tone. Coordination normal.  Testing bilaterally. Gait is steady.  Skin: Skin is warm and dry. She is not diaphoretic.  Psychiatric: She has a normal mood and affect. Her behavior is normal. Thought content normal.    ED Course  Procedures (including critical care time)  Labs Reviewed - No data to display No results found.   1. Headache   2. Abdominal pain       MDM  46yF with HA and abdominal pain. HAs chronic. Nonfocal neuro exam. Abdominal pain same as prior visit and low suspicion for emergent surgical process. Pt with multiple ED visits with pain related complaints, typically HA. Discussed with pt the need for outpt fu with neurologist and PCP. May benefit from pain management. Will refer to GI as well.        Raeford Razor, MD 01/16/12 [phone]

## 2012-01-18 NOTE — ED Provider Notes (Signed)
History    46 year old female with headache. History of migraines her current headache feel similar to prior. Denies trauma. Gradual onset a couple days ago. Headache is diffuse. Achy and ccasionally sharper pain. Associated with photophobia. No change in visual acuity. Mild nausea. No vomiting. No numbness, tingling or loss strength. Denies use of blood thinning medications. Is followed by a neurologist. Also complaining of some aching of her pelvic pain. Does not lateralize. Sometimes worse after eating. So she will mild nausea. No vomiting. No urinary complaints. No diarrhea.  CSN: [id number]  Arrival date & time 01/14/12  1719   First MD Initiated Contact with Patient 01/14/12 1856      Chief Complaint  Patient presents with  . Abdominal Cramping  . Migraine    (Consider location/radiation/quality/duration/timing/severity/associated sxs/prior treatment) HPI  Past Medical History  Diagnosis Date  . Depression   . Migraine     History reviewed. No pertinent past surgical history.  Family History  Problem Relation Age of Onset  . Hypertension Father   . Cancer Mother     History  Substance Use Topics  . Smoking status: Never Smoker   . Smokeless tobacco: Never Used  . Alcohol Use: Yes     rarely    OB History    Grav Para Term Preterm Abortions TAB SAB Ect Mult Living                  Review of Systems   Review of symptoms negative unless otherwise noted in HPI.   Allergies  Benadryl and Compazine  Home Medications   Current Outpatient Rx  Name Route Sig Dispense Refill  . BUTORPHANOL TARTRATE 10 MG/ML NA SOLN Nasal Place 1 spray into the nose every 4 (four) hours as needed. pain    . CALCIUM CARBONATE ANTACID 500 MG PO CHEW Oral Chew 1 tablet by mouth daily.    Marland Kitchen LAMOTRIGINE 100 MG PO TABS Oral Take 150 mg by mouth 2 (two) times daily.     Marland Kitchen LORAZEPAM 1 MG PO TABS Oral Take 1 mg by mouth every 6 (six) hours as needed. anxiety    . SERTRALINE HCL 100 MG  PO TABS Oral Take 100 mg by mouth 2 (two) times daily.    Marland Kitchen ZOLPIDEM TARTRATE 10 MG PO TABS Oral Take 10 mg by mouth at bedtime.     Marland Kitchen PANTOPRAZOLE SODIUM 40 MG PO TBEC Oral Take 1 tablet (40 mg total) by mouth daily. 30 tablet 0    BP 111/74  Pulse 78  Temp 98.4 F (36.9 C) (Oral)  Resp 18  SpO2 98%  Physical Exam  Nursing note and vitals reviewed. Constitutional: She is oriented to person, place, and time. She appears well-developed and well-nourished. No distress.  HENT:  Head: Normocephalic and atraumatic.  Eyes: Conjunctivae are normal. Right eye exhibits no discharge. Left eye exhibits no discharge.  Neck: Normal range of motion. Neck supple.  Cardiovascular: Normal rate, regular rhythm and normal heart sounds.  Exam reveals no gallop and no friction rub.   No murmur heard. Pulmonary/Chest: Effort normal and breath sounds normal. No respiratory distress.  Abdominal: Soft. She exhibits no distension. There is tenderness. There is no rebound and no guarding.       Mild tenderness in epigastrium w/o rebound or guarding.  Musculoskeletal: She exhibits no edema and no tenderness.  Lymphadenopathy:    She has no cervical adenopathy.  Neurological: She is alert and oriented to person, place, and time.  No cranial nerve deficit. She exhibits normal muscle tone. Coordination normal.       Good finger to nose b/l. Gait steady.  Skin: Skin is warm and dry.  Psychiatric: She has a normal mood and affect. Her behavior is normal. Thought content normal.    ED Course  Procedures (including critical care time)  Labs Reviewed  CBC WITH DIFFERENTIAL - Abnormal; Notable for the following:    WBC 12.4 (*)     Platelets 474 (*)     Monocytes Absolute 1.1 (*)     All other components within normal limits  URINALYSIS, ROUTINE W REFLEX MICROSCOPIC - Abnormal; Notable for the following:    Color, Urine AMBER (*)  BIOCHEMICALS MAY BE AFFECTED BY COLOR   APPearance CLOUDY (*)     Specific  Gravity, Urine >1.046 (*)     Hgb urine dipstick SMALL (*)     Bilirubin Urine SMALL (*)     Ketones, ur TRACE (*)     Protein, ur 30 (*)     All other components within normal limits  URINE MICROSCOPIC-ADD ON - Abnormal; Notable for the following:    Squamous Epithelial / LPF MANY (*)     All other components within normal limits  BASIC METABOLIC PANEL  LAB REPORT - SCANNED   No results found.   1. Headache   2. Abdominal pain       MDM  46yF with HA. Suspect primary HA. Consider emergent secondary causes such as bleed, infectious or mass but doubt. There is no history of trauma. Pt has a nonfocal neurological exam. Afebrile and neck supple. No use of blood thinning medication. Consider ocular etiology such as acute angle closure glaucoma but doubt. Pt denies acute change in visual acuity and eye exam unremarkable. Doubt temporal arteritis given age, no temporal tenderness and temporal artery pulsations palpable. Doubt CO poisoning. No contacts with similar symptoms. Doubt venous thrombosis. Doubt carotid or vertebral arteries dissection. Symptoms improved with meds. Feel that can be safely discharged, but strict return precautions discussed. Possible gastritis or PUD as etiology of abdominal pain. Will give trial of PPI. Doubt acute abdomen. Outpt fu.         Raeford Razor, MD 01/18/12 [phone]

## 2012-02-06 ENCOUNTER — Emergency Department (HOSPITAL_COMMUNITY)
Admission: EM | Admit: 2012-02-06 | Discharge: 2012-02-06 | Disposition: A | Discharge status: Home / Self Care | Payer: Self-pay | Attending: Emergency Medicine | Admitting: Emergency Medicine

## 2012-02-06 ENCOUNTER — Encounter (HOSPITAL_COMMUNITY): Payer: Self-pay | Admitting: Emergency Medicine

## 2012-02-06 DIAGNOSIS — F3289 Other specified depressive episodes: Secondary | ICD-10-CM | POA: Insufficient documentation

## 2012-02-06 DIAGNOSIS — F329 Major depressive disorder, single episode, unspecified: Secondary | ICD-10-CM | POA: Insufficient documentation

## 2012-02-06 DIAGNOSIS — G43909 Migraine, unspecified, not intractable, without status migrainosus: Secondary | ICD-10-CM | POA: Insufficient documentation

## 2012-02-06 DIAGNOSIS — Z79899 Other long term (current) drug therapy: Secondary | ICD-10-CM | POA: Insufficient documentation

## 2012-02-06 MED ORDER — BUTORPHANOL TARTRATE 2 MG/ML IJ SOLN
4.0000 mg | Freq: Once | INTRAMUSCULAR | Status: AC
  Administered 2012-02-06: 4 mg via INTRAMUSCULAR
  Filled 2012-02-06 (×2): qty 1

## 2012-02-06 MED ORDER — BUTORPHANOL TARTRATE 2 MG/ML IJ SOLN: 4.0000 mg | Freq: Once | INTRAMUSCULAR | Status: DC

## 2012-02-06 MED ORDER — HYDROMORPHONE HCL PF 1 MG/ML IJ SOLN
1.0000 mg | Freq: Once | INTRAMUSCULAR | Status: AC
  Administered 2012-02-06: 1 mg via INTRAMUSCULAR
  Filled 2012-02-06: qty 1

## 2012-02-06 MED ORDER — KETOROLAC TROMETHAMINE 60 MG/2ML IM SOLN
60.0000 mg | Freq: Once | INTRAMUSCULAR | Status: AC
  Administered 2012-02-06: 60 mg via INTRAMUSCULAR
  Filled 2012-02-06: qty 2

## 2012-02-06 MED ORDER — PROMETHAZINE HCL 25 MG/ML IJ SOLN
25.0000 mg | Freq: Once | INTRAMUSCULAR | Status: AC
  Administered 2012-02-06: 25 mg via INTRAMUSCULAR
  Filled 2012-02-06: qty 1

## 2012-02-06 NOTE — ED Provider Notes (Signed)
History     CSN: [id number]  Arrival date & time 02/06/12  1307   First MD Initiated Contact with Patient 02/06/12 1348      Chief Complaint  Patient presents with  . Migraine    (Consider location/radiation/quality/duration/timing/severity/associated sxs/prior treatment) HPI  46 year old female with hx of migraine presents complaining of headache. Onset yesterday, pain is to L face behind L eye, achy with occasional sharp pain, with light sensitivity.  Does endorse nausea without vomit.  Denies fever, chills, double vision, neck stiffness, or rash.  Denies recent trauma.  Reports "this is my typical migraine" STs she tried home remedy without relief.  Sts she is out of her intranasal stadol.  Also has been without insurance and haven't getting additional treatment from her neurologist, Dr. Daphine Deutscher.  Sts he is planning to try botox injection when she lost her job and now cannot have the procedure.  Reports her migraine is likely due to stress and not eating right.    Past Medical History  Diagnosis Date  . Depression   . Migraine     History reviewed. No pertinent past surgical history.  Family History  Problem Relation Age of Onset  . Hypertension Father   . Cancer Mother     History  Substance Use Topics  . Smoking status: Never Smoker   . Smokeless tobacco: Never Used  . Alcohol Use: Yes     rarely    OB History    Grav Para Term Preterm Abortions TAB SAB Ect Mult Living                  Review of Systems  All other systems reviewed and are negative.    Allergies  Benadryl and Compazine  Home Medications   Current Outpatient Rx  Name Route Sig Dispense Refill  . BUTORPHANOL TARTRATE 10 MG/ML NA SOLN Nasal Place 1 spray into the nose every 4 (four) hours as needed. pain    . CALCIUM CARBONATE ANTACID 500 MG PO CHEW Oral Chew 1 tablet by mouth daily.    Marland Kitchen LAMOTRIGINE 100 MG PO TABS Oral Take 150 mg by mouth 2 (two) times daily.     Marland Kitchen LORAZEPAM 1 MG PO  TABS Oral Take 1 mg by mouth every 6 (six) hours as needed. anxiety    . PANTOPRAZOLE SODIUM 40 MG PO TBEC Oral Take 1 tablet (40 mg total) by mouth daily. 30 tablet 0  . SERTRALINE HCL 100 MG PO TABS Oral Take 100 mg by mouth 2 (two) times daily.    Marland Kitchen ZOLPIDEM TARTRATE 10 MG PO TABS Oral Take 10 mg by mouth at bedtime.       BP 159/100  Pulse 118  Temp 97.5 F (36.4 C) (Oral)  Resp 18  SpO2 99%  LMP 02/05/2012  Physical Exam  Nursing note and vitals reviewed. Constitutional: She is oriented to person, place, and time. She appears well-developed and well-nourished. No distress.  HENT:  Head: Normocephalic and atraumatic.  Mouth/Throat: Oropharynx is clear and moist. No oropharyngeal exudate.  Eyes: Conjunctivae and EOM are normal. Pupils are equal, round, and reactive to light.  Neck: Normal range of motion. Neck supple. No Brudzinski's sign noted.  Musculoskeletal: Normal range of motion.  Neurological: She is alert and oriented to person, place, and time. She has normal strength. No cranial nerve deficit. She displays a negative Romberg sign. GCS eye subscore is 4. GCS verbal subscore is 5. GCS motor subscore is 6.  Skin: Skin is warm. No rash noted.    ED Course  Procedures (including critical care time)  Labs Reviewed - No data to display No results found.   No diagnosis found.    MDM  Acute on chronic migraine headache. Afebrile, no meningismal sign, no focal neuro deficits, normal gait, nontoxic.  Will give her normal migraine treatment Stadol/Phenergan/Toradol.  Recommend f/u with her neurologist for further care.    2:48 PM Pt sts she feels a bit better.  Will go home to rest.  Pt stable to be discharge.        Fayrene Helper, PA-C 02/06/12 1527

## 2012-02-06 NOTE — ED Notes (Signed)
Pt states "this is my typical bad migraine." States she has taken her medications and tried what she normally does but not working. Pt states started yesterday. Pt c/o of photophobia, nausea, sensitivity to smells and sounds. Denies dizziness. Pt states pain in the left temporal lobe.

## 2012-02-06 NOTE — ED Notes (Signed)
Ice pack given per request

## 2012-02-06 NOTE — ED Notes (Signed)
States has had migraines, this one started yesterday. States normally gets Stadol, phenergan and toradol IM/IV . Was seen here three weeks ago for ulcer sx and h/a. States meds worked quicker IV

## 2012-02-06 NOTE — ED Notes (Signed)
Placed yellow bracelet and red socks on pt.  

## 2012-02-09 NOTE — ED Provider Notes (Signed)
Medical screening examination/treatment/procedure(s) were conducted as a shared visit with non-physician practitioner(s) and myself.  I personally evaluated the patient during the encounter.  Patient has typical migraine headache. Frequently comes to the emergency department. Has run out of her Stadol nose spray. no Neuro deficits or meningeal signs.  Rx Stadol, Phenergan, Toradol  Donnetta Hutching, MD 02/09/12 [phone]

## 2012-02-10 ENCOUNTER — Encounter (HOSPITAL_COMMUNITY): Payer: Self-pay | Admitting: Emergency Medicine

## 2012-02-10 ENCOUNTER — Emergency Department (HOSPITAL_COMMUNITY)
Admission: EM | Admit: 2012-02-10 | Discharge: 2012-02-10 | Disposition: A | Discharge status: Home / Self Care | Payer: Self-pay | Attending: Emergency Medicine | Admitting: Emergency Medicine

## 2012-02-10 DIAGNOSIS — G43909 Migraine, unspecified, not intractable, without status migrainosus: Secondary | ICD-10-CM | POA: Insufficient documentation

## 2012-02-10 MED ORDER — KETOROLAC TROMETHAMINE 60 MG/2ML IM SOLN
60.0000 mg | Freq: Once | INTRAMUSCULAR | Status: AC
  Administered 2012-02-10: 60 mg via INTRAMUSCULAR
  Filled 2012-02-10: qty 2

## 2012-02-10 MED ORDER — PROMETHAZINE HCL 25 MG/ML IJ SOLN
25.0000 mg | Freq: Once | INTRAMUSCULAR | Status: AC
  Administered 2012-02-10: 25 mg via INTRAMUSCULAR
  Filled 2012-02-10: qty 1

## 2012-02-10 MED ORDER — BUTORPHANOL TARTRATE 2 MG/ML IJ SOLN
4.0000 mg | Freq: Once | INTRAMUSCULAR | Status: AC
  Administered 2012-02-10: 4 mg via INTRAMUSCULAR
  Filled 2012-02-10: qty 2

## 2012-02-10 MED ORDER — DEXAMETHASONE SODIUM PHOSPHATE 10 MG/ML IJ SOLN
10.0000 mg | Freq: Once | INTRAMUSCULAR | Status: AC
  Administered 2012-02-10: 10 mg via INTRAMUSCULAR
  Filled 2012-02-10: qty 1

## 2012-02-10 NOTE — ED Notes (Signed)
Pt c/o migraine headache, seen here for same on Monday, states it never completely went away.  Pt reports nausea and photophobia.

## 2012-02-11 ENCOUNTER — Encounter (HOSPITAL_COMMUNITY): Payer: Self-pay | Admitting: *Deleted

## 2012-02-11 ENCOUNTER — Emergency Department (HOSPITAL_COMMUNITY)
Admission: EM | Admit: 2012-02-11 | Discharge: 2012-02-11 | Disposition: A | Discharge status: Home / Self Care | Payer: Self-pay | Attending: Emergency Medicine | Admitting: Emergency Medicine

## 2012-02-11 DIAGNOSIS — G43909 Migraine, unspecified, not intractable, without status migrainosus: Secondary | ICD-10-CM | POA: Insufficient documentation

## 2012-02-11 DIAGNOSIS — F3289 Other specified depressive episodes: Secondary | ICD-10-CM | POA: Insufficient documentation

## 2012-02-11 DIAGNOSIS — F329 Major depressive disorder, single episode, unspecified: Secondary | ICD-10-CM | POA: Insufficient documentation

## 2012-02-11 MED ORDER — KETOROLAC TROMETHAMINE 30 MG/ML IJ SOLN
60.0000 mg | Freq: Once | INTRAMUSCULAR | Status: AC
  Administered 2012-02-11: 60 mg via INTRAMUSCULAR
  Filled 2012-02-11: qty 2

## 2012-02-11 MED ORDER — PROMETHAZINE HCL 25 MG/ML IJ SOLN
12.5000 mg | Freq: Once | INTRAMUSCULAR | Status: AC
  Administered 2012-02-11: 12.5 mg via INTRAVENOUS
  Filled 2012-02-11: qty 1

## 2012-02-11 MED ORDER — BUTORPHANOL TARTRATE 2 MG/ML IJ SOLN
4.0000 mg | Freq: Once | INTRAMUSCULAR | Status: AC
  Administered 2012-02-11: 4 mg via INTRAMUSCULAR
  Filled 2012-02-11: qty 2

## 2012-02-11 MED ORDER — DIPHENHYDRAMINE HCL 50 MG/ML IJ SOLN
25.0000 mg | Freq: Once | INTRAMUSCULAR | Status: AC
  Administered 2012-02-11: 25 mg via INTRAVENOUS
  Filled 2012-02-11: qty 1

## 2012-02-11 MED ORDER — DEXAMETHASONE SODIUM PHOSPHATE 10 MG/ML IJ SOLN
10.0000 mg | Freq: Once | INTRAMUSCULAR | Status: AC
  Administered 2012-02-11: 10 mg via INTRAMUSCULAR
  Filled 2012-02-11: qty 1

## 2012-02-11 NOTE — ED Provider Notes (Signed)
History     CSN: [id number]  Arrival date & time 02/11/12  1711   First MD Initiated Contact with Patient 02/11/12 2119      Chief Complaint  Patient presents with  . Migraine    (Consider location/radiation/quality/duration/timing/severity/associated sxs/prior treatment) HPI Comments: As the third visit this week for this patient's migraine that started Monday morning.  She states she received short duration relief with each past visit this week.  This is a typical migraine in the typical location, which is lasting longer than normal  Patient is a 46 y.o. female presenting with migraine. The history is provided by the patient.  Migraine This is a chronic problem. The current episode started in the past 7 days. The problem occurs constantly. The problem has been unchanged. Associated symptoms include headaches and nausea. Pertinent negatives include no arthralgias, fever, joint swelling, neck pain, vertigo, vomiting or weakness. The symptoms are aggravated by exertion. She has tried relaxation, rest and NSAIDs for the symptoms. The treatment provided no relief.    Past Medical History  Diagnosis Date  . Depression   . Migraine     History reviewed. No pertinent past surgical history.  Family History  Problem Relation Age of Onset  . Hypertension Father   . Cancer Mother     History  Substance Use Topics  . Smoking status: Never Smoker   . Smokeless tobacco: Never Used  . Alcohol Use: Yes     rarely    OB History    Grav Para Term Preterm Abortions TAB SAB Ect Mult Living                  Review of Systems  Constitutional: Negative for fever.  HENT: Negative for neck pain.   Eyes: Positive for photophobia. Negative for pain.  Gastrointestinal: Positive for nausea. Negative for vomiting.  Musculoskeletal: Negative for back pain, joint swelling and arthralgias.  Neurological: Positive for headaches. Negative for dizziness, vertigo and weakness.    Allergies    Benadryl and Compazine  Home Medications   Current Outpatient Rx  Name Route Sig Dispense Refill  . ASPIRIN-ACETAMINOPHEN-CAFFEINE 500-325-65 MG PO PACK Oral Take 2 packets by mouth every 6 (six) hours as needed.    Marland Kitchen BUTORPHANOL TARTRATE 10 MG/ML NA SOLN Nasal Place 1 spray into the nose every 4 (four) hours as needed. pain    . CALCIUM CARBONATE ANTACID 500 MG PO CHEW Oral Chew 1 tablet by mouth daily. For headache    . LAMOTRIGINE 100 MG PO TABS Oral Take 150 mg by mouth 2 (two) times daily.     Marland Kitchen LORAZEPAM 1 MG PO TABS Oral Take 1 mg by mouth every 6 (six) hours as needed. anxiety    . PANTOPRAZOLE SODIUM 40 MG PO TBEC Oral Take 1 tablet (40 mg total) by mouth daily. 30 tablet 0  . SERTRALINE HCL 100 MG PO TABS Oral Take 100 mg by mouth 2 (two) times daily.    Marland Kitchen ZOLPIDEM TARTRATE 10 MG PO TABS Oral Take 10 mg by mouth at bedtime.       BP 138/77  Pulse 102  Temp 99 F (37.2 C) (Oral)  Wt 205 lb (92.987 kg)  SpO2 100%  LMP 02/05/2012  Physical Exam  Constitutional: She is oriented to person, place, and time. She appears well-developed and well-nourished.  HENT:  Head: Normocephalic.  Eyes: Pupils are equal, round, and reactive to light.  Neck: Normal range of motion.  Cardiovascular: Normal rate.  Pulmonary/Chest: Effort normal.  Musculoskeletal: Normal range of motion.  Neurological: She is alert and oriented to person, place, and time.  Skin: Skin is warm. No rash noted.  Psychiatric: Her behavior is normal.    ED Course  Procedures (including critical care time)  Labs Reviewed - No data to display No results found.   1. Migraine       MDM   Will treat with Reglan, Benadryl, Stadol and Toradol than reassess        Arman Filter, NP 02/11/12 2334  Arman Filter, NP 02/11/12 1478  Arman Filter, NP 02/11/12 [phone]

## 2012-02-11 NOTE — ED Notes (Signed)
Pt states ready for d/c home.

## 2012-02-11 NOTE — ED Notes (Signed)
Pt states "migraine started last Monday, have been seen here, h/a has never gone away."

## 2012-02-11 NOTE — ED Notes (Signed)
Pt now stating, "can't a doctor just see me, it's in my chart what I take, give me the injection and let me go home"; informed pt will need to be assessed by an MD in a Tx Room; pt verbalized understanding.

## 2012-02-12 NOTE — ED Provider Notes (Signed)
Medical screening examination/treatment/procedure(s) were performed by non-physician practitioner and as supervising physician I was immediately available for consultation/collaboration.  Aneka Fagerstrom, MD 02/12/12 0215 

## 2012-02-12 NOTE — ED Provider Notes (Signed)
History     CSN: [id number]  Arrival date & time 02/10/12  1212   First MD Initiated Contact with Patient 02/10/12 1236      Chief Complaint  Patient presents with  . Migraine    (Consider location/radiation/quality/duration/timing/severity/associated sxs/prior treatment) HPI Patient is a 46 year old female with history of chronic migraines who presents today with her chronic bilateral frontal migraine that she rates as a 10 out of 10. She endorses nausea but denies any vomiting, fevers, rash, or neck pain. Patient is slightly tachycardic upon presentation. She says normally for these headaches she receives a dose of 4 mg of Stadol, 25 mg ephedrine, and 60 mg of Toradol IM. With this her headache is normally resolved. She endorses photophobia and phonophobia. There no other associated or modifying factors. Past Medical History  Diagnosis Date  . Depression   . Migraine     History reviewed. No pertinent past surgical history.  Family History  Problem Relation Age of Onset  . Hypertension Father   . Cancer Mother     History  Substance Use Topics  . Smoking status: Never Smoker   . Smokeless tobacco: Never Used  . Alcohol Use: Yes     rarely    OB History    Grav Para Term Preterm Abortions TAB SAB Ect Mult Living                  Review of Systems  Constitutional: Negative.   Eyes: Positive for photophobia.  Respiratory: Negative.   Cardiovascular: Negative.   Gastrointestinal: Positive for nausea.  Genitourinary: Negative.   Musculoskeletal: Negative.   Skin: Negative.   Neurological: Positive for headaches.  Hematological: Negative.   Psychiatric/Behavioral: Negative.   All other systems reviewed and are negative.    Allergies  Benadryl and Compazine  Home Medications   Current Outpatient Rx  Name Route Sig Dispense Refill  . ASPIRIN-ACETAMINOPHEN-CAFFEINE 500-325-65 MG PO PACK Oral Take 2 packets by mouth every 6 (six) hours as needed.    Marland Kitchen  BUTORPHANOL TARTRATE 10 MG/ML NA SOLN Nasal Place 1 spray into the nose every 4 (four) hours as needed. pain    . CALCIUM CARBONATE ANTACID 500 MG PO CHEW Oral Chew 1 tablet by mouth daily. For headache    . LAMOTRIGINE 100 MG PO TABS Oral Take 150 mg by mouth 2 (two) times daily.     Marland Kitchen LORAZEPAM 1 MG PO TABS Oral Take 1 mg by mouth every 6 (six) hours as needed. anxiety    . PANTOPRAZOLE SODIUM 40 MG PO TBEC Oral Take 1 tablet (40 mg total) by mouth daily. 30 tablet 0  . SERTRALINE HCL 100 MG PO TABS Oral Take 100 mg by mouth 2 (two) times daily.    Marland Kitchen ZOLPIDEM TARTRATE 10 MG PO TABS Oral Take 10 mg by mouth at bedtime.       BP 148/68  Pulse 109  Temp 98.7 F (37.1 C) (Oral)  Resp 22  SpO2 100%  LMP 02/05/2012  Physical Exam  Nursing note and vitals reviewed. GEN: Well-developed, well-nourished female in no distress, uncomfortable appearing HEENT: Atraumatic, normocephalic. Oropharynx clear without erythema EYES: PERRLA BL, no scleral icterus. NECK: Trachea midline, no meningismus CV: mild tachycardia rhythm. No murmurs, rubs, or gallops PULM: No respiratory distress.  No crackles, wheezes, or rales. GI: soft, non-tender. No guarding, rebound, or tenderness. + bowel sounds  GU: deferred Neuro: cranial nerves grossly 2-12 intact, no abnormalities of strength or sensation, A and  O x 3 MSK: Patient moves all 4 extremities symmetrically, no deformity, edema, or injury noted Skin: No rashes petechiae, purpura, or jaundice Psych: no abnormality of mood   ED Course  Procedures (including critical care time)  Labs Reviewed - No data to display No results found.   1. Migraine       MDM  Patient was evaluated by myself. Based on evaluation patient was treated with her typical migraine cocktail. Following this patient reported she was feeling much better. Patient was given a dose of Decadron to prevent recurrence of her headache. She was discharged in good condition. On my  reassessment patient had had resolution of her tachycardia and nausea as well as near resolution of her headache.        Cyndra Numbers, MD 02/12/12 [phone]

## 2012-03-09 ENCOUNTER — Encounter (HOSPITAL_COMMUNITY): Payer: Self-pay | Admitting: *Deleted

## 2012-03-09 ENCOUNTER — Emergency Department (HOSPITAL_COMMUNITY)
Admission: EM | Admit: 2012-03-09 | Discharge: 2012-03-09 | Disposition: A | Discharge status: Home / Self Care | Payer: Self-pay | Attending: Emergency Medicine | Admitting: Emergency Medicine

## 2012-03-09 DIAGNOSIS — Z79899 Other long term (current) drug therapy: Secondary | ICD-10-CM | POA: Insufficient documentation

## 2012-03-09 DIAGNOSIS — G43909 Migraine, unspecified, not intractable, without status migrainosus: Secondary | ICD-10-CM | POA: Insufficient documentation

## 2012-03-09 MED ORDER — BUTORPHANOL TARTRATE 2 MG/ML IJ SOLN
4.0000 mg | Freq: Once | INTRAMUSCULAR | Status: AC
  Administered 2012-03-09: 4 mg via INTRAMUSCULAR
  Filled 2012-03-09: qty 2

## 2012-03-09 MED ORDER — PROMETHAZINE HCL 25 MG/ML IJ SOLN
25.0000 mg | Freq: Once | INTRAMUSCULAR | Status: AC
  Administered 2012-03-09: 25 mg via INTRAMUSCULAR
  Filled 2012-03-09: qty 1

## 2012-03-09 MED ORDER — BUTORPHANOL TARTRATE 10 MG/ML NA SOLN: 1.0000 | NASAL | Status: DC | PRN

## 2012-03-09 MED ORDER — KETOROLAC TROMETHAMINE 60 MG/2ML IM SOLN
60.0000 mg | Freq: Once | INTRAMUSCULAR | Status: AC
  Administered 2012-03-09: 60 mg via INTRAMUSCULAR
  Filled 2012-03-09: qty 2

## 2012-03-09 NOTE — ED Provider Notes (Signed)
Medical screening examination/treatment/procedure(s) were performed by non-physician practitioner and as supervising physician I was immediately available for consultation/collaboration.   Celene Kras, MD 03/09/12 [phone]

## 2012-03-09 NOTE — ED Notes (Signed)
Ice bag given per pt. request

## 2012-03-09 NOTE — ED Provider Notes (Signed)
History     CSN: [id number]  Arrival date & time 03/09/12  1640   First MD Initiated Contact with Patient 03/09/12 1722      Chief Complaint  Patient presents with  . Migraine    (Consider location/radiation/quality/duration/timing/severity/associated sxs/prior treatment) HPI Comments: Carlinda Ohlson 46 y.o. female   The chief complaint is: Patient presents with:   Migraine   The patient has medical history significant for:   Past Medical History:   Depression                                                   Migraine                                                    Patient with history of migraines presents with frontal and occular pain similar to her other migraines. Associated symptoms include nausea and photophobia. Denies fever, chills, night sweats. Denies vomiting or diarrhea. Denies that this is the worst headache of her life.      The history is provided by the patient.    Past Medical History  Diagnosis Date  . Depression   . Migraine     History reviewed. No pertinent past surgical history.  Family History  Problem Relation Age of Onset  . Hypertension Father   . Cancer Mother     History  Substance Use Topics  . Smoking status: Never Smoker   . Smokeless tobacco: Never Used  . Alcohol Use: Yes     rarely    OB History    Grav Para Term Preterm Abortions TAB SAB Ect Mult Living                  Review of Systems  Constitutional: Negative for fever, chills and diaphoresis.  Eyes: Positive for photophobia.  Gastrointestinal: Positive for nausea. Negative for vomiting, abdominal pain and diarrhea.  Neurological: Positive for headaches.  All other systems reviewed and are negative.    Allergies  Benadryl and Compazine  Home Medications   Current Outpatient Rx  Name Route Sig Dispense Refill  . ASPIRIN-ACETAMINOPHEN-CAFFEINE 500-325-65 MG PO PACK Oral Take 2 packets by mouth every 6 (six) hours as needed.    Marland Kitchen BUTORPHANOL  TARTRATE 10 MG/ML NA SOLN Nasal Place 1 spray into the nose every 4 (four) hours as needed. pain    . CALCIUM CARBONATE ANTACID 500 MG PO CHEW Oral Chew 1 tablet by mouth daily. For headache    . LAMOTRIGINE 100 MG PO TABS Oral Take 150 mg by mouth 2 (two) times daily.     Marland Kitchen LORAZEPAM 1 MG PO TABS Oral Take 1 mg by mouth every 6 (six) hours as needed. anxiety    . SERTRALINE HCL 100 MG PO TABS Oral Take 100 mg by mouth 2 (two) times daily.    Marland Kitchen ZOLPIDEM TARTRATE 10 MG PO TABS Oral Take 10 mg by mouth at bedtime.       BP 139/84  Pulse 106  Temp 99.8 F (37.7 C) (Oral)  Resp 18  SpO2 98%  Physical Exam  Nursing note and vitals reviewed. Constitutional: She appears well-developed and well-nourished. No distress.  HENT:  Head: Normocephalic and atraumatic.  Mouth/Throat: Oropharynx is clear and moist.  Eyes: Conjunctivae and EOM are normal. Pupils are equal, round, and reactive to light. No scleral icterus.  Neck: Normal range of motion. Neck supple.  Cardiovascular: Regular rhythm and normal heart sounds.   Pulmonary/Chest: Effort normal and breath sounds normal.  Abdominal: Soft. Bowel sounds are normal. There is no tenderness.  Neurological: She is alert.  Skin: Skin is warm and dry.    ED Course  Procedures (including critical care time)  Labs Reviewed - No data to display No results found.   1. Migraine       MDM  Patient presented with migraine similar to her past migraines. Pain rated 9/10 Patient given migraine cocktail of Toradol, phenergan, stadol. Patient reassessed, improved and ready to go home. No red flags for subarachnoid, glaucoma, or temporal arteritis. Return precautions given verbally and in discharge summary        Pixie Casino, Cordelia Poche 03/09/12 1942

## 2012-03-09 NOTE — ED Notes (Signed)
Bedside report given by previous RN. Pt requesting food and drink states she is ready to go home.

## 2012-03-09 NOTE — ED Notes (Signed)
Patient with c/o migraines and is now experiencing migraines.  Patient is sensitive to light and is nauseated

## 2012-03-13 ENCOUNTER — Emergency Department (HOSPITAL_COMMUNITY)
Admission: EM | Admit: 2012-03-13 | Discharge: 2012-03-13 | Disposition: A | Discharge status: Home / Self Care | Payer: Self-pay | Attending: Emergency Medicine | Admitting: Emergency Medicine

## 2012-03-13 ENCOUNTER — Encounter (HOSPITAL_COMMUNITY): Payer: Self-pay | Admitting: Nurse Practitioner

## 2012-03-13 DIAGNOSIS — G43909 Migraine, unspecified, not intractable, without status migrainosus: Secondary | ICD-10-CM | POA: Insufficient documentation

## 2012-03-13 HISTORY — DX: Anxiety disorder, unspecified: F41.9

## 2012-03-13 MED ORDER — PROMETHAZINE HCL 25 MG/ML IJ SOLN
25.0000 mg | Freq: Once | INTRAMUSCULAR | Status: AC
  Administered 2012-03-13: 25 mg via INTRAMUSCULAR
  Filled 2012-03-13: qty 1

## 2012-03-13 MED ORDER — BUTORPHANOL TARTRATE 2 MG/ML IJ SOLN
4.0000 mg | Freq: Once | INTRAMUSCULAR | Status: AC
  Administered 2012-03-13: 4 mg via INTRAMUSCULAR
  Filled 2012-03-13 (×2): qty 1

## 2012-03-13 MED ORDER — KETOROLAC TROMETHAMINE 60 MG/2ML IM SOLN
60.0000 mg | Freq: Once | INTRAMUSCULAR | Status: AC
  Administered 2012-03-13: 60 mg via INTRAMUSCULAR
  Filled 2012-03-13: qty 2

## 2012-03-13 NOTE — ED Notes (Addendum)
Pt. Woke up with severe migraine this morning. Pt has a hx of migraines and was seen here last week for same. Pt. Has taken Goody's powders. Pt. Is out of stadol nose spray which relieves migraines but is not able to refill till next week. Denies  N/V

## 2012-03-13 NOTE — ED Provider Notes (Signed)
History     CSN: [id number]  Arrival date & time 03/13/12  1717   First MD Initiated Contact with Patient 03/13/12 1805      Chief Complaint  Patient presents with  . Migraine    (Consider location/radiation/quality/duration/timing/severity/associated sxs/prior treatment) HPI Comments: Patient with history of Migraines presents today with a Migraine Headache.  Headache has been present for approximately 14 hours.  Headache gradual in onset and similar to previous headaches.  She has tried taking Goody's Powder for the pain, but does not feel that it has helped.  She is followed by Dr. Daphine Deutscher with Neurology and has an appointment scheduled on September 12.  She states that she normally takes Stadol for migraines, but has a pain contract with Dr. Daphine Deutscher and can not get a prescription for this until her appointment.    Patient is a 46 y.o. female presenting with migraine. The history is provided by the patient.  Migraine This is a recurrent problem. The current episode started today. The problem occurs constantly. The problem has been gradually worsening. Associated symptoms include headaches and nausea. Pertinent negatives include no chills, fever, neck pain, numbness, rash, visual change, vomiting or weakness.    Past Medical History  Diagnosis Date  . Depression   . Migraine   . Anxiety     History reviewed. No pertinent past surgical history.  Family History  Problem Relation Age of Onset  . Hypertension Father   . Cancer Mother     History  Substance Use Topics  . Smoking status: Never Smoker   . Smokeless tobacco: Never Used  . Alcohol Use: Yes     rarely    OB History    Grav Para Term Preterm Abortions TAB SAB Ect Mult Living                  Review of Systems  Constitutional: Negative for fever and chills.  HENT: Negative for neck pain and neck stiffness.   Eyes: Positive for photophobia. Negative for pain and visual disturbance.  Gastrointestinal:  Positive for nausea. Negative for vomiting.  Skin: Negative for rash.  Neurological: Positive for headaches. Negative for dizziness, syncope, facial asymmetry, weakness, light-headedness and numbness.  Psychiatric/Behavioral: Negative for confusion.    Allergies  Benadryl and Compazine  Home Medications   Current Outpatient Rx  Name Route Sig Dispense Refill  . ASPIRIN-ACETAMINOPHEN-CAFFEINE 500-325-65 MG PO PACK Oral Take 2 packets by mouth every 6 (six) hours as needed. Pain    . BUTORPHANOL TARTRATE 10 MG/ML NA SOLN Nasal Place 1 spray into the nose every 4 (four) hours as needed. pain    . CALCIUM CARBONATE ANTACID 500 MG PO CHEW Oral Chew 1 tablet by mouth daily. For headache    . LAMOTRIGINE 100 MG PO TABS Oral Take 150 mg by mouth 2 (two) times daily.     Marland Kitchen LORAZEPAM 1 MG PO TABS Oral Take 1 mg by mouth every 6 (six) hours as needed. anxiety    . SERTRALINE HCL 100 MG PO TABS Oral Take 100 mg by mouth 2 (two) times daily.    Marland Kitchen ZOLPIDEM TARTRATE 10 MG PO TABS Oral Take 10 mg by mouth at bedtime.       BP 135/83  Pulse 116  Temp 98.6 F (37 C) (Oral)  Resp 26  SpO2 98%  Physical Exam  Nursing note and vitals reviewed. Constitutional: She appears well-developed and well-nourished. No distress.  HENT:  Head: Normocephalic and atraumatic.  Mouth/Throat: Oropharynx is clear and moist.  Eyes: EOM are normal. Pupils are equal, round, and reactive to light.  Neck: Normal range of motion. Neck supple.  Cardiovascular: Normal rate, regular rhythm and normal heart sounds.   Pulmonary/Chest: Effort normal and breath sounds normal.  Neurological: She is alert. She has normal strength. No cranial nerve deficit or sensory deficit. Gait normal.  Skin: Skin is warm and dry. No rash noted. She is not diaphoretic.  Psychiatric: She has a normal mood and affect.    ED Course  Procedures (including critical care time)  Labs Reviewed - No data to display No results found.   No  diagnosis found.  7:27 PM Reassessed patient.  She reports that pain has improved at this time.  MDM  Pt HA treated and improved while in ED.  Presentation is like pts typical HA and non concerning for [facility], [facility], Meningitis, or temporal arteritis. Pt is afebrile with no focal neuro deficits, nuchal rigidity, or change in vision. Pt is to follow up with PCP to discuss prophylactic medication. Pt verbalizes understanding and is agreeable with plan to dc.         Pascal Lux [place], PA-C 03/13/12 2102

## 2012-03-14 NOTE — ED Provider Notes (Signed)
Medical screening examination/treatment/procedure(s) were performed by non-physician practitioner and as supervising physician I was immediately available for consultation/collaboration.  Jones Skene, M.D.     Jones Skene, MD 03/14/12 1214

## 2012-03-20 ENCOUNTER — Emergency Department (HOSPITAL_COMMUNITY)
Admission: EM | Admit: 2012-03-20 | Discharge: 2012-03-20 | Disposition: A | Discharge status: Home / Self Care | Payer: Self-pay | Attending: Emergency Medicine | Admitting: Emergency Medicine

## 2012-03-20 ENCOUNTER — Encounter (HOSPITAL_COMMUNITY): Payer: Self-pay | Admitting: Emergency Medicine

## 2012-03-20 DIAGNOSIS — G43909 Migraine, unspecified, not intractable, without status migrainosus: Secondary | ICD-10-CM | POA: Insufficient documentation

## 2012-03-20 DIAGNOSIS — F411 Generalized anxiety disorder: Secondary | ICD-10-CM | POA: Insufficient documentation

## 2012-03-20 MED ORDER — PROMETHAZINE HCL 25 MG/ML IJ SOLN
25.0000 mg | Freq: Once | INTRAMUSCULAR | Status: AC
  Administered 2012-03-20: 25 mg via INTRAMUSCULAR
  Filled 2012-03-20: qty 1

## 2012-03-20 MED ORDER — BUTORPHANOL TARTRATE 2 MG/ML IJ SOLN
4.0000 mg | Freq: Once | INTRAMUSCULAR | Status: AC
  Administered 2012-03-20: 4 mg via INTRAMUSCULAR
  Filled 2012-03-20: qty 1

## 2012-03-20 MED ORDER — BUTORPHANOL TARTRATE 2 MG/ML IJ SOLN
4.0000 mg | Freq: Once | INTRAMUSCULAR | Status: DC
  Filled 2012-03-20: qty 1

## 2012-03-20 MED ORDER — KETOROLAC TROMETHAMINE 60 MG/2ML IM SOLN
60.0000 mg | Freq: Once | INTRAMUSCULAR | Status: AC
  Administered 2012-03-20: 60 mg via INTRAMUSCULAR
  Filled 2012-03-20: qty 2

## 2012-03-20 NOTE — ED Notes (Signed)
Pt alert and oriented x4. Respirations even and unlabored, bilateral symmetrical rise and fall of chest. Skin warm and dry. In no acute distress. Denies needs.   

## 2012-03-20 NOTE — ED Provider Notes (Signed)
History     CSN: [id number]  Arrival date & time 03/20/12  0605   First MD Initiated Contact with Patient 03/20/12 [phone]      Chief Complaint  Patient presents with  . Migraine    (Consider location/radiation/quality/duration/timing/severity/associated sxs/prior treatment) The history is provided by the patient.  Maureen Chen is a 46 y.o. female hx of migraines, depression here with another episode of HA. She notes that she lost her insurance 3 months ago and ran out of her lamictal. Since then, she has been having more migraines. Since last night, she has been having her typical migraines. She has headache, nausea but no vomiting. Denies auras but she does have some photophobia. No fever or neck stiffness or recent travels. She has been in the ED frequently for the same problem and will see her neurologist next week.    Past Medical History  Diagnosis Date  . Depression   . Migraine   . Anxiety     History reviewed. No pertinent past surgical history.  Family History  Problem Relation Age of Onset  . Hypertension Father   . Cancer Mother     History  Substance Use Topics  . Smoking status: Never Smoker   . Smokeless tobacco: Never Used  . Alcohol Use: Yes     rarely    OB History    Grav Para Term Preterm Abortions TAB SAB Ect Mult Living                  Review of Systems  Neurological: Positive for headaches.  All other systems reviewed and are negative.    Allergies  Benadryl and Compazine  Home Medications   Current Outpatient Rx  Name Route Sig Dispense Refill  . ASPIRIN-ACETAMINOPHEN-CAFFEINE 500-325-65 MG PO PACK Oral Take 2 packets by mouth every 6 (six) hours as needed. Pain    . BUTORPHANOL TARTRATE 10 MG/ML NA SOLN Nasal Place 1 spray into the nose every 4 (four) hours as needed. pain    . CALCIUM CARBONATE ANTACID 500 MG PO CHEW Oral Chew 1 tablet by mouth daily. For headache    . LORAZEPAM 1 MG PO TABS Oral Take 1 mg by mouth every 6  (six) hours as needed. anxiety    . SERTRALINE HCL 100 MG PO TABS Oral Take 100 mg by mouth 2 (two) times daily.    Marland Kitchen ZOLPIDEM TARTRATE 10 MG PO TABS Oral Take 10 mg by mouth at bedtime.     Marland Kitchen LAMOTRIGINE 100 MG PO TABS Oral Take 150 mg by mouth 2 (two) times daily.       BP 125/73  Pulse 94  Temp 97.5 F (36.4 C) (Oral)  Resp 16  SpO2 98%  LMP 02/27/2012  Physical Exam  Nursing note and vitals reviewed. Constitutional: She is oriented to person, place, and time. She appears well-developed and well-nourished.       Uncomfortable, holding her head  HENT:  Head: Normocephalic.  Mouth/Throat: Oropharynx is clear and moist.  Eyes: Conjunctivae and EOM are normal. Pupils are equal, round, and reactive to light.  Neck: Normal range of motion. Neck supple.  Cardiovascular: Normal rate, regular rhythm and normal heart sounds.   Pulmonary/Chest: Effort normal and breath sounds normal.  Abdominal: Soft. Bowel sounds are normal.  Musculoskeletal: Normal range of motion. She exhibits no edema.  Neurological: She is alert and oriented to person, place, and time. No cranial nerve deficit. Coordination normal.  Skin: Skin is warm  and dry.  Psychiatric: She has a normal mood and affect. Her behavior is normal. Judgment and thought content normal.    ED Course  Procedures (including critical care time)  Labs Reviewed - No data to display No results found.   1. Migraine       MDM  Maureen Chen is a 46 y.o. female here with migraines. Likely her typical migraines. Not concerning for subarachnoid or temporal arteritis or meningitis. Will treat symptomatically with toradol, stadol, and phenergan and reassess.   8:52 AM Feels better after meds. Will d/c and patient has f/u.       Richardean Canal, MD 03/20/12 [phone]

## 2012-03-20 NOTE — ED Notes (Signed)
Pt states she has a migraine that started yesterday evening and has progressively gotten worse throughout the night  Pt states has nausea without vomiting  Pt states she took some goody powders and used ice packs for the pain without relief   Pt states she has stadol nasal spray but doesn't get it until Saturday

## 2012-03-20 NOTE — ED Notes (Signed)
Pt reports that she feels better and the medicine "knocked the edge off" and that her ride is on the way now. md alerted

## 2012-03-31 ENCOUNTER — Encounter (HOSPITAL_COMMUNITY): Payer: Self-pay | Admitting: Emergency Medicine

## 2012-03-31 ENCOUNTER — Emergency Department (HOSPITAL_COMMUNITY)
Admission: EM | Admit: 2012-03-31 | Discharge: 2012-03-31 | Disposition: A | Discharge status: Home / Self Care | Payer: Self-pay | Attending: Emergency Medicine | Admitting: Emergency Medicine

## 2012-03-31 DIAGNOSIS — Z888 Allergy status to other drugs, medicaments and biological substances status: Secondary | ICD-10-CM | POA: Insufficient documentation

## 2012-03-31 DIAGNOSIS — G43909 Migraine, unspecified, not intractable, without status migrainosus: Secondary | ICD-10-CM | POA: Insufficient documentation

## 2012-03-31 DIAGNOSIS — F3289 Other specified depressive episodes: Secondary | ICD-10-CM | POA: Insufficient documentation

## 2012-03-31 DIAGNOSIS — E669 Obesity, unspecified: Secondary | ICD-10-CM | POA: Insufficient documentation

## 2012-03-31 DIAGNOSIS — Z8249 Family history of ischemic heart disease and other diseases of the circulatory system: Secondary | ICD-10-CM | POA: Insufficient documentation

## 2012-03-31 DIAGNOSIS — F411 Generalized anxiety disorder: Secondary | ICD-10-CM | POA: Insufficient documentation

## 2012-03-31 DIAGNOSIS — Z809 Family history of malignant neoplasm, unspecified: Secondary | ICD-10-CM | POA: Insufficient documentation

## 2012-03-31 DIAGNOSIS — F329 Major depressive disorder, single episode, unspecified: Secondary | ICD-10-CM | POA: Insufficient documentation

## 2012-03-31 MED ORDER — BUTORPHANOL TARTRATE 2 MG/ML IJ SOLN
4.0000 mg | Freq: Once | INTRAMUSCULAR | Status: AC
  Administered 2012-03-31: 4 mg via INTRAMUSCULAR
  Filled 2012-03-31 (×2): qty 1

## 2012-03-31 MED ORDER — KETOROLAC TROMETHAMINE 60 MG/2ML IM SOLN
60.0000 mg | Freq: Once | INTRAMUSCULAR | Status: AC
  Administered 2012-03-31: 60 mg via INTRAMUSCULAR
  Filled 2012-03-31: qty 2

## 2012-03-31 MED ORDER — BUTORPHANOL TARTRATE 2 MG/ML IJ SOLN: 4.0000 mg | Freq: Once | INTRAMUSCULAR | Status: DC

## 2012-03-31 MED ORDER — PROMETHAZINE HCL 25 MG/ML IJ SOLN
25.0000 mg | Freq: Once | INTRAMUSCULAR | Status: AC
  Administered 2012-03-31: 25 mg via INTRAMUSCULAR
  Filled 2012-03-31: qty 1

## 2012-03-31 NOTE — ED Provider Notes (Signed)
History     CSN: [id number]  Arrival date & time 03/31/12  1711   First MD Initiated Contact with Patient 03/31/12 1739      Chief Complaint  Patient presents with  . Migraine    (Consider location/radiation/quality/duration/timing/severity/associated sxs/prior treatment) HPI  46 year old female with history of migraine, depression, anxiety. Patient request for treatments of her usual migraine. Reports pain to both temples, started this AM when woke up, has been persistent.  Has nauseated and did vomited after eating salmon recently.  Has phonophobia, photophobia.  Denies vision changes, rash, or fever.  Currently on her menstruation and think it's worsening her headache. Has neurologist and willing to f/u.  Sts she doesn't have a job and unable to afford her medication.  Has been actively job seeking.   Past Medical History  Diagnosis Date  . Depression   . Migraine   . Anxiety     History reviewed. No pertinent past surgical history.  Family History  Problem Relation Age of Onset  . Hypertension Father   . Cancer Mother     History  Substance Use Topics  . Smoking status: Never Smoker   . Smokeless tobacco: Never Used  . Alcohol Use: Yes     rarely    OB History    Grav Para Term Preterm Abortions TAB SAB Ect Mult Living                  Review of Systems  All other systems reviewed and are negative.    Allergies  Benadryl and Compazine  Home Medications   Current Outpatient Rx  Name Route Sig Dispense Refill  . ASPIRIN-ACETAMINOPHEN-CAFFEINE 500-325-65 MG PO PACK Oral Take 2 packets by mouth every 6 (six) hours as needed. Pain    . BUTORPHANOL TARTRATE 10 MG/ML NA SOLN Nasal Place 1 spray into the nose every 4 (four) hours as needed. pain    . CALCIUM CARBONATE ANTACID 500 MG PO CHEW Oral Chew 1 tablet by mouth daily. For headache    . LAMOTRIGINE 100 MG PO TABS Oral Take 150 mg by mouth 2 (two) times daily.     . SERTRALINE HCL 100 MG PO TABS Oral  Take 100 mg by mouth 2 (two) times daily.    Marland Kitchen ZOLPIDEM TARTRATE 10 MG PO TABS Oral Take 10 mg by mouth at bedtime.       BP 143/88  Pulse 112  Temp 98.9 F (37.2 C) (Oral)  Resp 18  Ht 5\' 6"  (1.676 m)  Wt 210 lb (95.255 kg)  BMI 33.89 kg/m2  SpO2 99%  LMP 02/27/2012  Physical Exam  Nursing note and vitals reviewed. Constitutional: She is oriented to person, place, and time. She appears well-developed and well-nourished. No distress.       obese  HENT:  Head: Normocephalic and atraumatic.  Right Ear: External ear normal.  Left Ear: External ear normal.  Nose: Nose normal.  Mouth/Throat: Oropharynx is clear and moist. No oropharyngeal exudate.  Eyes: Conjunctivae normal and EOM are normal. Pupils are equal, round, and reactive to light.  Neck: Neck supple.  Cardiovascular: Normal rate and regular rhythm.   Pulmonary/Chest: Effort normal and breath sounds normal.  Abdominal: Soft.  Lymphadenopathy:    She has no cervical adenopathy.  Neurological: She is alert and oriented to person, place, and time.  Skin: Skin is warm. No rash noted.  Psychiatric: She has a normal mood and affect.    ED Course  Procedures (including  critical care time)  Labs Reviewed - No data to display No results found.   No diagnosis found.  1. migraine  MDM  Migraine headache with hx of migraine.  No red flag.  Non focal neuro exam. No recent head trauma. No fever. Doubt meningitis. Doubt intracranial bleed. Doubt normal pressure hydrocephalus. No indication for imaging   In no apparent distress.  Pt request for her usual treatment including stadol/toradol/phenergan.  Amenable to f/u with her neurologist for further care.     7:03 PM Pt sts she feels better and request to be discharged.  Recommend f/u with her doctor for further care.  Able to tolerates PO.    Nursing notes reviewed and considered in documentation  Previous records reviewed and considered               Fayrene Helper, PA-C 03/31/12 1904

## 2012-03-31 NOTE — ED Notes (Signed)
Patient c/o of severe migraine.  She states that she vomited 4 times today and is nauseated.  Patient is not in any distress. Hx. Of migraines.

## 2012-03-31 NOTE — ED Provider Notes (Signed)
Medical screening examination/treatment/procedure(s) were performed by non-physician practitioner and as supervising physician I was immediately available for consultation/collaboration.  Doug Sou, MD 03/31/12 [phone]

## 2012-04-01 ENCOUNTER — Emergency Department (HOSPITAL_COMMUNITY)
Admission: EM | Admit: 2012-04-01 | Discharge: 2012-04-01 | Disposition: A | Discharge status: Home / Self Care | Payer: Self-pay | Attending: Emergency Medicine | Admitting: Emergency Medicine

## 2012-04-01 DIAGNOSIS — G43909 Migraine, unspecified, not intractable, without status migrainosus: Secondary | ICD-10-CM | POA: Insufficient documentation

## 2012-04-01 MED ORDER — BUTORPHANOL TARTRATE 2 MG/ML IJ SOLN
4.0000 mg | Freq: Once | INTRAMUSCULAR | Status: AC
  Administered 2012-04-01: 4 mg via INTRAVENOUS
  Filled 2012-04-01 (×2): qty 1

## 2012-04-01 MED ORDER — KETOROLAC TROMETHAMINE 30 MG/ML IJ SOLN
60.0000 mg | Freq: Once | INTRAMUSCULAR | Status: AC
  Administered 2012-04-01: 60 mg via INTRAVENOUS
  Filled 2012-04-01: qty 2

## 2012-04-01 MED ORDER — PROMETHAZINE HCL 25 MG/ML IJ SOLN
25.0000 mg | Freq: Once | INTRAMUSCULAR | Status: AC
  Administered 2012-04-01: 25 mg via INTRAVENOUS
  Filled 2012-04-01: qty 1

## 2012-04-01 MED ORDER — BUTORPHANOL TARTRATE 2 MG/ML IJ SOLN
2.0000 mg | Freq: Once | INTRAMUSCULAR | Status: AC
  Administered 2012-04-01: 2 mg via INTRAVENOUS
  Filled 2012-04-01: qty 1

## 2012-04-01 NOTE — ED Notes (Signed)
Pt complaining of migraine headache that started yesterday. Pt states that she was seen here yesterday and received a "migraine cocktail 4mg  stadol, 25mg  phenergan, and 60mg  Toradol" IM. Pt states the medication yesterday knocked the edge off, but headache was worse now.

## 2012-04-01 NOTE — ED Notes (Signed)
Nancie Neas, PA at bedside

## 2012-04-01 NOTE — ED Provider Notes (Signed)
History     CSN: [id number]  Arrival date & time 04/01/12  1106   First MD Initiated Contact with Patient 04/01/12 1225      Chief Complaint  Patient presents with  . Migraine    (Consider location/radiation/quality/duration/timing/severity/associated sxs/prior treatment) HPI Comments: Maureen Chen 46 y.o. female   The chief complaint is: Patient presents with:   Migraine   The patient has medical history significant for:   Past Medical History:   Depression                                                   Migraine                                                     Anxiety                                                     Patient presents to the ED with the complaint of migraine 10/10 on both sides. She states this is like her other migraines. Associated symptoms include photophobia. Patient was here yesterday with same complaint and got her usual migraine cocktail with temporary relief but return of symptoms today. Denies fever or chills. Denies NVD or abdominal pain. Denies visual disturbance or diplopia. Denies that this is the worse headache of he life.      The history is provided by the patient.    Past Medical History  Diagnosis Date  . Depression   . Migraine   . Anxiety     No past surgical history on file.  Family History  Problem Relation Age of Onset  . Hypertension Father   . Cancer Mother     History  Substance Use Topics  . Smoking status: Never Smoker   . Smokeless tobacco: Never Used  . Alcohol Use: Yes     rarely    OB History    Grav Para Term Preterm Abortions TAB SAB Ect Mult Living                  Review of Systems  Constitutional: Negative for fever and chills.  Eyes: Negative for visual disturbance.  Gastrointestinal: Negative for nausea, vomiting, diarrhea and anal bleeding.  Neurological: Positive for headaches.  All other systems reviewed and are negative.    Allergies  Benadryl and Compazine  Home  Medications   Current Outpatient Rx  Name Route Sig Dispense Refill  . ASPIRIN-ACETAMINOPHEN-CAFFEINE 500-325-65 MG PO PACK Oral Take 2 packets by mouth every 6 (six) hours as needed. Pain    . BUTORPHANOL TARTRATE 10 MG/ML NA SOLN Nasal Place 1 spray into the nose every 4 (four) hours as needed. pain    . CALCIUM CARBONATE ANTACID 500 MG PO CHEW Oral Chew 1 tablet by mouth daily. For headache    . ZOLPIDEM TARTRATE 10 MG PO TABS Oral Take 10 mg by mouth at bedtime.     Marland Kitchen LAMOTRIGINE 100 MG PO TABS Oral Take 150 mg by mouth 2 (two) times  daily.     . SERTRALINE HCL 100 MG PO TABS Oral Take 100 mg by mouth 2 (two) times daily.      BP 139/70  Pulse 87  Temp 98.4 F (36.9 C) (Oral)  Resp 16  SpO2 100%  LMP 02/27/2012  Physical Exam  Nursing note and vitals reviewed. Constitutional: She is oriented to person, place, and time. She appears well-developed.  HENT:  Head: Normocephalic and atraumatic.  Mouth/Throat: Oropharynx is clear and moist.       No tenderness to palpation of maxillary or frontal sinuses.  Eyes: Conjunctivae normal and EOM are normal. Pupils are equal, round, and reactive to light. No scleral icterus.  Neck: Normal range of motion. Neck supple.       No neck stiffness or meningeal signs.  Cardiovascular: Regular rhythm and normal heart sounds.   Pulmonary/Chest: Effort normal and breath sounds normal.  Abdominal: Soft. Bowel sounds are normal. There is no tenderness.  Neurological: She is alert and oriented to person, place, and time. No cranial nerve deficit. She exhibits normal muscle tone. Coordination normal.       Cranial nerves II-XII intact. No sensory deficient. No pronator drift, negative romberg. Good finger to nose.  Skin: Skin is warm.    ED Course  Procedures (including critical care time)  Labs Reviewed - No data to display No results found.   1. Migraine       MDM  Patient presented with migraine.No neuro deficits. Patient given  migraine cocktail with resolution of symptoms. Patient discharged with return precautions. No red flags for meningitis or subarachnoid.         Pixie Casino, PA-C 04/01/12 1622

## 2012-04-01 NOTE — ED Notes (Signed)
Patient is resting comfortably. - Sleeping

## 2012-04-02 NOTE — ED Provider Notes (Signed)
Medical screening examination/treatment/procedure(s) were performed by non-physician practitioner and as supervising physician I was immediately available for consultation/collaboration.  Raeford Razor, MD 04/02/12 [phone]

## 2012-04-07 ENCOUNTER — Emergency Department (HOSPITAL_COMMUNITY)
Admission: EM | Admit: 2012-04-07 | Discharge: 2012-04-07 | Disposition: A | Discharge status: Home / Self Care | Payer: Self-pay | Attending: Emergency Medicine | Admitting: Emergency Medicine

## 2012-04-07 ENCOUNTER — Encounter (HOSPITAL_COMMUNITY): Payer: Self-pay | Admitting: *Deleted

## 2012-04-07 ENCOUNTER — Emergency Department (HOSPITAL_COMMUNITY): Payer: Self-pay

## 2012-04-07 DIAGNOSIS — Z7982 Long term (current) use of aspirin: Secondary | ICD-10-CM | POA: Insufficient documentation

## 2012-04-07 DIAGNOSIS — R109 Unspecified abdominal pain: Secondary | ICD-10-CM | POA: Insufficient documentation

## 2012-04-07 DIAGNOSIS — Z79899 Other long term (current) drug therapy: Secondary | ICD-10-CM | POA: Insufficient documentation

## 2012-04-07 DIAGNOSIS — G43909 Migraine, unspecified, not intractable, without status migrainosus: Secondary | ICD-10-CM | POA: Insufficient documentation

## 2012-04-07 LAB — COMPREHENSIVE METABOLIC PANEL
ALT: 12 U/L (ref 0–35)
Calcium: 10.3 mg/dL (ref 8.4–10.5)
GFR calc Af Amer: >90 mL/min (ref >90)
Glucose, Bld: 100 mg/dL — ABNORMAL HIGH (ref 70–99)
Sodium: 135 mEq/L (ref 135–145)
Total Protein: 8 g/dL (ref 6.0–8.3)

## 2012-04-07 LAB — URINE MICROSCOPIC-ADD ON

## 2012-04-07 LAB — URINALYSIS, ROUTINE W REFLEX MICROSCOPIC
Leukocytes, UA: NEGATIVE
Nitrite: NEGATIVE
Specific Gravity, Urine: >1.046 — ABNORMAL HIGH (ref 1.005–1.030)
pH: 6 (ref 5.0–8.0)

## 2012-04-07 LAB — CBC
MCH: 27.8 pg (ref 26.0–34.0)
MCHC: 33.2 g/dL (ref 30.0–36.0)
Platelets: 419 10*3/uL — ABNORMAL HIGH (ref 150–400)

## 2012-04-07 MED ORDER — BUTORPHANOL TARTRATE 2 MG/ML IJ SOLN: 2.0000 mg | Freq: Once | INTRAMUSCULAR | Status: DC

## 2012-04-07 MED ORDER — FENTANYL CITRATE 0.05 MG/ML IJ SOLN
50.0000 ug | Freq: Once | INTRAMUSCULAR | Status: DC
  Filled 2012-04-07: qty 2

## 2012-04-07 MED ORDER — PROMETHAZINE HCL 25 MG/ML IJ SOLN
25.0000 mg | Freq: Four times a day (QID) | INTRAMUSCULAR | Status: DC | PRN
  Filled 2012-04-07: qty 1

## 2012-04-07 MED ORDER — FENTANYL CITRATE 0.05 MG/ML IJ SOLN: 50.0000 ug | Freq: Once | INTRAMUSCULAR | Status: DC

## 2012-04-07 MED ORDER — ONDANSETRON HCL 4 MG/2ML IJ SOLN
4.0000 mg | Freq: Once | INTRAMUSCULAR | Status: AC
  Administered 2012-04-07: 4 mg via INTRAVENOUS
  Filled 2012-04-07: qty 2

## 2012-04-07 MED ORDER — BUTORPHANOL TARTRATE 2 MG/ML IJ SOLN
4.0000 mg | Freq: Once | INTRAMUSCULAR | Status: AC
  Administered 2012-04-07: 4 mg via INTRAVENOUS
  Filled 2012-04-07: qty 2

## 2012-04-07 MED ORDER — KETOROLAC TROMETHAMINE 30 MG/ML IJ SOLN
30.0000 mg | Freq: Once | INTRAMUSCULAR | Status: AC
  Administered 2012-04-07: 30 mg via INTRAVENOUS
  Filled 2012-04-07: qty 1

## 2012-04-07 MED ORDER — IOHEXOL 300 MG/ML  SOLN
100.0000 mL | Freq: Once | INTRAMUSCULAR | Status: AC | PRN
  Administered 2012-04-07: 100 mL via INTRAVENOUS

## 2012-04-07 MED ORDER — SUCRALFATE 1 G PO TABS: 1.0000 g | ORAL_TABLET | Freq: Four times a day (QID) | ORAL | Status: DC

## 2012-04-07 NOTE — ED Notes (Signed)
[place], PA at bedside  

## 2012-04-07 NOTE — ED Notes (Signed)
Pt very upset that her phenergan was given so long after the stadol was given.  Stated that stadol/phenergan are supposed to be given together and she doesn't feel a head buzz like she is supposed to.  Ebbie Ridge, PA notified.

## 2012-04-07 NOTE — ED Notes (Signed)
Pt. Given ice pack. 

## 2012-04-07 NOTE — ED Notes (Signed)
Abdominal pain, vomiting,  Headache,  For day

## 2012-04-07 NOTE — ED Notes (Signed)
IV attempts unsuccessful x 2. IV team paged.

## 2012-04-07 NOTE — ED Notes (Signed)
Nadine Counts, CT Tech at bedside to administer oral contrast for CT.

## 2012-04-07 NOTE — ED Notes (Signed)
Patient transported to CT 

## 2012-04-07 NOTE — ED Notes (Signed)
Pt very upset, stating that she doesn't feel any relief from stadol and zofran- states that she normally feels more fuzzy, and stadol normally "hits quicker" .

## 2012-04-07 NOTE — ED Provider Notes (Signed)
History     CSN: [id number]  Arrival date & time 04/07/12  0216   First MD Initiated Contact with Patient 04/07/12 0445      Chief Complaint  Patient presents with  . Abdominal Pain  . Migraine    (Consider location/radiation/quality/duration/timing/severity/associated sxs/prior treatment) HPI  Pt presents to the ER with complaints of abdominal pain and vomiting for 1 day.  Abdominal pain: pt has been having intermittent crampy diffuse abdominal pains over the past couple of months. However, she acutely developed LLQ pain that feels like "knifes are in me and twisting around". She denies ever having abdominal pain of this location or quality in the past. It has been associated with vomiting and nausea.  Headache: Pt has a long history of chronic migraines. She has been seen multiple times in the ER for this and has her "migraine cocktail" which includes stadol, Toradol and phenergan. The pain she is experiencing is not different from her normal pain.  Past Medical History  Diagnosis Date  . Depression   . Migraine   . Anxiety     History reviewed. No pertinent past surgical history.  Family History  Problem Relation Age of Onset  . Hypertension Father   . Cancer Mother     History  Substance Use Topics  . Smoking status: Never Smoker   . Smokeless tobacco: Never Used  . Alcohol Use: Yes     rarely    OB History    Grav Para Term Preterm Abortions TAB SAB Ect Mult Living                  Review of Systems   Review of Systems  Gen: no weight loss, fevers, chills, night sweats , + headache Eyes: no discharge or drainage, no occular pain or visual changes  Nose: no epistaxis or rhinorrhea  Mouth: no dental pain, no sore throat  Neck: no neck pain  Lungs:No wheezing, coughing or hemoptysis CV: no chest pain, palpitations, dependent edema or orthopnea  Abd: + abdominal pain, + nausea, vomiting  GU: no dysuria or gross hematuria  MSK:  No abnormalities    Neuro: no headache, no focal neurologic deficits  Skin: no abnormalities Psyche: negative.    Allergies  Benadryl and Compazine  Home Medications   Current Outpatient Rx  Name Route Sig Dispense Refill  . ASPIRIN-ACETAMINOPHEN-CAFFEINE 500-325-65 MG PO PACK Oral Take 2 packets by mouth every 6 (six) hours as needed. Pain    . BUTORPHANOL TARTRATE 10 MG/ML NA SOLN Nasal Place 1 spray into the nose every 4 (four) hours as needed. pain    . CALCIUM CARBONATE ANTACID 500 MG PO CHEW Oral Chew 1 tablet by mouth daily. For headache    . LAMOTRIGINE 100 MG PO TABS Oral Take 150 mg by mouth 2 (two) times daily.     . SERTRALINE HCL 100 MG PO TABS Oral Take 100 mg by mouth 2 (two) times daily.    Marland Kitchen ZOLPIDEM TARTRATE 10 MG PO TABS Oral Take 10 mg by mouth at bedtime.       BP 138/68  Pulse 108  Temp 98.4 F (36.9 C) (Oral)  Resp 20  Ht 5\' 7"  (1.702 m)  Wt 218 lb 3.2 oz (98.975 kg)  BMI 34.17 kg/m2  SpO2 100%  LMP 03/31/2012  Physical Exam  Nursing note and vitals reviewed. Constitutional: She appears well-developed and well-nourished. No distress.  HENT:  Head: Normocephalic and atraumatic.  Eyes: Pupils are equal,  round, and reactive to light.  Neck: Normal range of motion. Neck supple.  Cardiovascular: Normal rate and regular rhythm.   Pulmonary/Chest: Effort normal.  Abdominal: Soft. She exhibits no distension and no mass. There is tenderness (LLQ has most severe pain, some mild pain in the RLQ). There is no rebound and no guarding.  Neurological: She is alert.  Skin: Skin is warm and dry.    ED Course  Procedures (including critical care time)  Labs Reviewed  CBC - Abnormal; Notable for the following:    WBC 16.6 (*)     Platelets 419 (*)     All other components within normal limits  COMPREHENSIVE METABOLIC PANEL - Abnormal; Notable for the following:    Glucose, Bld 100 (*)     All other components within normal limits  URINALYSIS, ROUTINE W REFLEX MICROSCOPIC -  Abnormal; Notable for the following:    Color, Urine AMBER (*)  BIOCHEMICALS MAY BE AFFECTED BY COLOR   APPearance TURBID (*)     Specific Gravity, Urine >1.046 (*)     Hgb urine dipstick SMALL (*)     Bilirubin Urine SMALL (*)     Ketones, ur TRACE (*)     Protein, ur 30 (*)     All other components within normal limits  URINE MICROSCOPIC-ADD ON - Abnormal; Notable for the following:    Squamous Epithelial / LPF FEW (*)     Crystals CA OXALATE CRYSTALS (*)     All other components within normal limits   No results found.   No diagnosis found.    MDM   I have ordered fentanyl IM for pt since their is difficulty obtaining IV access. I have also written her for stadol and phenergan. No Toradol until acute abdomen is ruled out. CT abd/pelv with contrast ordered. I suspect possible diverticulitis.  End of shift care handed over to [facility], PA-C       Dorthula Matas, [place] 04/07/12 [phone]

## 2012-04-07 NOTE — ED Provider Notes (Signed)
Medical screening examination/treatment/procedure(s) were performed by non-physician practitioner and as supervising physician I was immediately available for consultation/collaboration.  Chukwuebuka Churchill, MD 04/07/12 0736 

## 2012-04-07 NOTE — ED Notes (Signed)
IV team at bedside to attempt IV access 

## 2012-04-08 LAB — POCT PREGNANCY, URINE: Preg Test, Ur: NEGATIVE

## 2012-04-14 ENCOUNTER — Emergency Department (HOSPITAL_COMMUNITY): Payer: Self-pay

## 2012-04-14 ENCOUNTER — Emergency Department (HOSPITAL_COMMUNITY)
Admission: EM | Admit: 2012-04-14 | Discharge: 2012-04-14 | Disposition: A | Discharge status: Home / Self Care | Payer: Self-pay | Attending: Emergency Medicine | Admitting: Emergency Medicine

## 2012-04-14 ENCOUNTER — Encounter (HOSPITAL_COMMUNITY): Payer: Self-pay | Admitting: *Deleted

## 2012-04-14 DIAGNOSIS — F411 Generalized anxiety disorder: Secondary | ICD-10-CM | POA: Insufficient documentation

## 2012-04-14 DIAGNOSIS — R51 Headache: Secondary | ICD-10-CM | POA: Insufficient documentation

## 2012-04-14 DIAGNOSIS — R109 Unspecified abdominal pain: Secondary | ICD-10-CM | POA: Insufficient documentation

## 2012-04-14 DIAGNOSIS — Z79899 Other long term (current) drug therapy: Secondary | ICD-10-CM | POA: Insufficient documentation

## 2012-04-14 DIAGNOSIS — F3289 Other specified depressive episodes: Secondary | ICD-10-CM | POA: Insufficient documentation

## 2012-04-14 DIAGNOSIS — R112 Nausea with vomiting, unspecified: Secondary | ICD-10-CM | POA: Insufficient documentation

## 2012-04-14 DIAGNOSIS — F329 Major depressive disorder, single episode, unspecified: Secondary | ICD-10-CM | POA: Insufficient documentation

## 2012-04-14 LAB — URINALYSIS, MICROSCOPIC ONLY
Bilirubin Urine: NEGATIVE
Glucose, UA: NEGATIVE mg/dL
Ketones, ur: NEGATIVE mg/dL
Leukocytes, UA: NEGATIVE
Specific Gravity, Urine: 1.016 (ref 1.005–1.030)
pH: 8 (ref 5.0–8.0)

## 2012-04-14 LAB — CBC WITH DIFFERENTIAL/PLATELET
Basophils Absolute: 0.1 10*3/uL (ref 0.0–0.1)
Basophils Relative: 0 % (ref 0–1)
Hemoglobin: 12.7 g/dL (ref 12.0–15.0)
MCHC: 32.9 g/dL (ref 30.0–36.0)
Neutro Abs: 9.7 10*3/uL — ABNORMAL HIGH (ref 1.7–7.7)
Neutrophils Relative %: 69 % (ref 43–77)
Platelets: 459 10*3/uL — ABNORMAL HIGH (ref 150–400)
RDW: 14.7 % (ref 11.5–15.5)

## 2012-04-14 LAB — COMPREHENSIVE METABOLIC PANEL
AST: 18 U/L (ref 0–37)
Albumin: 3.8 g/dL (ref 3.5–5.2)
Alkaline Phosphatase: 62 U/L (ref 39–117)
Chloride: 101 mEq/L (ref 96–112)
Potassium: 4.3 mEq/L (ref 3.5–5.1)
Sodium: 135 mEq/L (ref 135–145)
Total Bilirubin: 0.3 mg/dL (ref 0.3–1.2)

## 2012-04-14 LAB — POCT PREGNANCY, URINE: Preg Test, Ur: NEGATIVE

## 2012-04-14 LAB — PREGNANCY, URINE: Preg Test, Ur: NEGATIVE

## 2012-04-14 MED ORDER — ONDANSETRON HCL 4 MG/2ML IJ SOLN
4.0000 mg | Freq: Once | INTRAMUSCULAR | Status: AC
  Administered 2012-04-14: 4 mg via INTRAVENOUS
  Filled 2012-04-14: qty 2

## 2012-04-14 MED ORDER — BUTORPHANOL TARTRATE 1 MG/ML IJ SOLN: 1.0000 mg | Freq: Once | INTRAMUSCULAR | Status: DC

## 2012-04-14 MED ORDER — HYDROMORPHONE HCL PF 1 MG/ML IJ SOLN
1.0000 mg | Freq: Once | INTRAMUSCULAR | Status: AC
  Administered 2012-04-14: 1 mg via INTRAVENOUS
  Filled 2012-04-14: qty 1

## 2012-04-14 MED ORDER — PANTOPRAZOLE SODIUM 40 MG PO TBEC: 40.0000 mg | DELAYED_RELEASE_TABLET | Freq: Every day | ORAL | Status: DC

## 2012-04-14 MED ORDER — LORAZEPAM 2 MG/ML IJ SOLN: 1.0000 mg | Freq: Once | INTRAMUSCULAR | Status: DC

## 2012-04-14 MED ORDER — HYDROMORPHONE HCL PF 1 MG/ML IJ SOLN
0.5000 mg | Freq: Once | INTRAMUSCULAR | Status: AC
  Administered 2012-04-14: 0.5 mg via INTRAVENOUS
  Filled 2012-04-14: qty 1

## 2012-04-14 MED ORDER — BUTORPHANOL TARTRATE 2 MG/ML IJ SOLN
INTRAMUSCULAR | Status: AC
  Filled 2012-04-14: qty 1

## 2012-04-14 MED ORDER — BUTORPHANOL TARTRATE 2 MG/ML IJ SOLN
1.0000 mg | Freq: Once | INTRAMUSCULAR | Status: AC
  Administered 2012-04-14: 1 mg via INTRAVENOUS

## 2012-04-14 MED ORDER — SODIUM CHLORIDE 0.9 % IV BOLUS (SEPSIS)
1000.0000 mL | Freq: Once | INTRAVENOUS | Status: AC
  Administered 2012-04-14: 1000 mL via INTRAVENOUS

## 2012-04-14 MED ORDER — GI COCKTAIL ~~LOC~~
30.0000 mL | Freq: Once | ORAL | Status: AC
  Administered 2012-04-14: 30 mL via ORAL
  Filled 2012-04-14: qty 30

## 2012-04-14 MED ORDER — PANTOPRAZOLE SODIUM 40 MG IV SOLR
40.0000 mg | Freq: Once | INTRAVENOUS | Status: AC
  Administered 2012-04-14: 40 mg via INTRAVENOUS
  Filled 2012-04-14: qty 40

## 2012-04-14 MED ORDER — HYDROCODONE-ACETAMINOPHEN 5-500 MG PO TABS: 1.0000 | ORAL_TABLET | Freq: Four times a day (QID) | ORAL | Status: DC | PRN

## 2012-04-14 NOTE — ED Notes (Signed)
Dilaudid 0.5mg  given iv slow push. Pain scale 10/10

## 2012-04-14 NOTE — ED Provider Notes (Signed)
History     CSN: [id number]  Arrival date & time 04/14/12  1204   First MD Initiated Contact with Patient 04/14/12 1241      Chief Complaint  Patient presents with  . Abdominal Pain  . Nausea  . Emesis  . Headache    (Consider location/radiation/quality/duration/timing/severity/associated sxs/prior treatment) Patient is a 46 y.o. female presenting with abdominal pain, vomiting, and headaches. The history is provided by the patient.  Abdominal Pain The primary symptoms of the illness include abdominal pain and vomiting. The primary symptoms of the illness do not include fever or shortness of breath.  Symptoms associated with the illness do not include chills or back pain.  Emesis  Associated symptoms include abdominal pain. Pertinent negatives include no chills, no cough and no fever.  Headache  Associated symptoms include vomiting. Pertinent negatives include no fever and no shortness of breath.  pt c/o epigastric, upper abd pain in past couple weeks. Constant. Non radiating. Dull. Moderate. ? Worse at times w eating but no consistent exacerbating or alleviating factors. No fever or chills. Nausea. No vomiting. Having normal bms. States same pain intermittently in past for months, states was placed on acid blocker med at one pain w transient improvement. States was seen in ed 1 week ago for same, had ct, and was told to try carafate - no change since then. No hx pud, gallstones or pancreatitis. No prior abd surgery. Denies distension. Having normal bms. No cp or discomfort. No cough or sob. No gu c/o. No lower abd or pelvic pain.     Past Medical History  Diagnosis Date  . Depression   . Migraine   . Anxiety     History reviewed. No pertinent past surgical history.  Family History  Problem Relation Age of Onset  . Hypertension Father   . Cancer Mother     History  Substance Use Topics  . Smoking status: Never Smoker   . Smokeless tobacco: Never Used  . Alcohol Use:  Yes     rarely    OB History    Grav Para Term Preterm Abortions TAB SAB Ect Mult Living                  Review of Systems  Constitutional: Negative for fever and chills.  HENT: Negative for neck pain.   Eyes: Negative for redness.  Respiratory: Negative for cough and shortness of breath.   Cardiovascular: Negative for chest pain.  Gastrointestinal: Positive for vomiting and abdominal pain.  Genitourinary: Negative for flank pain.  Musculoskeletal: Negative for back pain.  Skin: Negative for rash.  Neurological: Negative for syncope.  Hematological: Does not bruise/bleed easily.  Psychiatric/Behavioral: Negative for confusion.    Allergies  Benadryl and Compazine  Home Medications   Current Outpatient Rx  Name Route Sig Dispense Refill  . ASPIRIN-ACETAMINOPHEN-CAFFEINE 500-325-65 MG PO PACK Oral Take 2 packets by mouth every 6 (six) hours as needed. Pain    . BUTORPHANOL TARTRATE 10 MG/ML NA SOLN Nasal Place 1 spray into the nose every 4 (four) hours as needed. pain    . CALCIUM CARBONATE ANTACID 500 MG PO CHEW Oral Chew 1 tablet by mouth 3 (three) times daily as needed. For indigestion.    Marland Kitchen LAMOTRIGINE 100 MG PO TABS Oral Take 150 mg by mouth 2 (two) times daily.     . SERTRALINE HCL 100 MG PO TABS Oral Take 100 mg by mouth 2 (two) times daily.    . SUCRALFATE  1 G PO TABS Oral Take 1 tablet (1 g total) by mouth 4 (four) times daily. 28 tablet 0  . ZOLPIDEM TARTRATE 10 MG PO TABS Oral Take 10 mg by mouth at bedtime.       BP 122/83  Pulse 114  Temp 98.6 F (37 C) (Oral)  Resp 19  SpO2 99%  LMP 03/31/2012  Physical Exam  Nursing note and vitals reviewed. Constitutional: She appears well-developed and well-nourished. No distress.  Eyes: Conjunctivae normal are normal. No scleral icterus.  Neck: Neck supple. No tracheal deviation present.  Cardiovascular: Normal rate, regular rhythm, normal heart sounds and intact distal pulses.   Pulmonary/Chest: Effort normal  and breath sounds normal. No respiratory distress.  Abdominal: Soft. Normal appearance and bowel sounds are normal. She exhibits no distension and no mass. There is tenderness. There is no rebound and no guarding.       Epigastric tenderness, no rebound or guarding.   Genitourinary:       No cva tenderness  Musculoskeletal: She exhibits no edema.  Neurological: She is alert.  Skin: Skin is warm and dry. No rash noted.  Psychiatric: She has a normal mood and affect.    ED Course  Procedures (including critical care time)   Labs Reviewed  CBC WITH DIFFERENTIAL  COMPREHENSIVE METABOLIC PANEL  LIPASE, BLOOD  URINALYSIS, MICROSCOPIC ONLY  URINALYSIS, ROUTINE W REFLEX MICROSCOPIC  PREGNANCY, URINE   Results for orders placed during the hospital encounter of 04/14/12  CBC WITH DIFFERENTIAL      Component Value Range   WBC 13.9 (*) 4.0 - 10.5 K/uL   RBC 4.55  3.87 - 5.11 MIL/uL   Hemoglobin 12.7  12.0 - 15.0 g/dL   HCT 40.9  81.1 - 91.4 %   MCV 84.8  78.0 - 100.0 fL   MCH 27.9  26.0 - 34.0 pg   MCHC 32.9  30.0 - 36.0 g/dL   RDW 78.2  95.6 - 21.3 %   Platelets 459 (*) 150 - 400 K/uL   Neutrophils Relative 69  43 - 77 %   Neutro Abs 9.7 (*) 1.7 - 7.7 K/uL   Lymphocytes Relative 20  12 - 46 %   Lymphs Abs 2.8  0.7 - 4.0 K/uL   Monocytes Relative 9  3 - 12 %   Monocytes Absolute 1.2 (*) 0.1 - 1.0 K/uL   Eosinophils Relative 2  0 - 5 %   Eosinophils Absolute 0.2  0.0 - 0.7 K/uL   Basophils Relative 0  0 - 1 %   Basophils Absolute 0.1  0.0 - 0.1 K/uL  COMPREHENSIVE METABOLIC PANEL      Component Value Range   Sodium 135  135 - 145 mEq/L   Potassium 4.3  3.5 - 5.1 mEq/L   Chloride 101  96 - 112 mEq/L   CO2 22  19 - 32 mEq/L   Glucose, Bld 92  70 - 99 mg/dL   BUN 7  6 - 23 mg/dL   Creatinine, Ser 0.86  0.50 - 1.10 mg/dL   Calcium 9.2  8.4 - 57.8 mg/dL   Total Protein 7.3  6.0 - 8.3 g/dL   Albumin 3.8  3.5 - 5.2 g/dL   AST 18  0 - 37 U/L   ALT 10  0 - 35 U/L   Alkaline  Phosphatase 62  39 - 117 U/L   Total Bilirubin 0.3  0.3 - 1.2 mg/dL   GFR calc non Af Amer >90  >  90 mL/min   GFR calc Af Amer >90  >90 mL/min  LIPASE, BLOOD      Component Value Range   Lipase 31  11 - 59 U/L  URINALYSIS, MICROSCOPIC ONLY      Component Value Range   Color, Urine YELLOW  YELLOW   APPearance CLOUDY (*) CLEAR   Specific Gravity, Urine 1.016  1.005 - 1.030   pH 8.0  5.0 - 8.0   Glucose, UA NEGATIVE  NEGATIVE mg/dL   Hgb urine dipstick NEGATIVE  NEGATIVE   Bilirubin Urine NEGATIVE  NEGATIVE   Ketones, ur NEGATIVE  NEGATIVE mg/dL   Protein, ur NEGATIVE  NEGATIVE mg/dL   Urobilinogen, UA 0.2  0.0 - 1.0 mg/dL   Nitrite NEGATIVE  NEGATIVE   Leukocytes, UA NEGATIVE  NEGATIVE   Squamous Epithelial / LPF RARE  RARE  PREGNANCY, URINE      Component Value Range   Preg Test, Ur NEGATIVE  NEGATIVE  POCT PREGNANCY, URINE      Component Value Range   Preg Test, Ur NEGATIVE  NEGATIVE   US Abdomen Complete  04/14/2012  *RADIOLOGY REPORT*  Clinical Data:  Abdominal pain, evaluate for gallstones  COMPLETE ABDOMINAL ULTRASOUND  Comparison:  CT abdomen pelvis dated 04/07/2012  Findings:  Gallbladder:  No gallstones, gallbladder wall thickening, or pericholecystic fluid.  Negative sonographic Murphy's sign.  Common bile duct:  Measures 4 mm.  Liver:  Hyperechoic hepatic parenchyma, suggesting hepatic steatosis.  No focal hepatic lesion is seen.  IVC:  Appears normal.  Pancreas:  Incompletely visualized but grossly unremarkable.  Spleen:  Measures 4.9 cm.  Right Kidney:  Measures 10.7 cm.  No mass or hydronephrosis.  Left Kidney:  Measures 10.7 cm.  No mass or hydronephrosis.  Abdominal aorta:  No aneurysm identified.  IMPRESSION: Possible hepatic steatosis.  Otherwise negative abdominal ultrasound.   Original Report Authenticated By: Charline Bills, M.D.    Ct Abdomen Pelvis W Contrast  04/07/2012  *RADIOLOGY REPORT*  Clinical Data: Abdominal pain.  Nausea and vomiting.  CT ABDOMEN AND  PELVIS WITH CONTRAST  Technique:  Multidetector CT imaging of the abdomen and pelvis was performed following the standard protocol during bolus administration of intravenous contrast.  Contrast: OMNIPAQUE IOHEXOL 300 MG/ML  SOLN  Comparison: No priors.  Findings:  Lung Bases: Tiny right-sided bipolar hernia is incidentally noted. Small hiatal hernia.  Abdomen/Pelvis:  The enhanced appearance of the liver, gallbladder, pancreas, spleen, bilateral adrenal glands and bilateral kidneys is unremarkable.  The appendix is normal (retrocecal in position).  No ascites or pneumoperitoneum and no pathologic distension of bowel.  No definite pathologic lymphadenopathy identified within the abdomen or pelvis.  Uterus, bilateral ovaries and urinary bladder are unremarkable in appearance.  Musculoskeletal: There are no aggressive appearing lytic or blastic lesions noted in the visualized portions of the skeleton.  IMPRESSION: 1.  No acute findings in the abdomen or pelvis to account for the patient's symptoms. 2.  Specifically, the appendix is normal. 3.  Small hiatal hernia.   Original Report Authenticated By: Florencia Reasons, M.D.        MDM  Iv ns. Dilaudid iv, zofran iv, protonix iv.   Labs.  Reviewed nursing notes and prior charts for additional history.   Recent ct abd 9/22 neg for acute process. U/s neg acute.  Recheck abd soft nt.  Pt requests stadol for pain. Stadol 1 mg iv.   No nv in ed. Discussed plan w pt. Will rx protonix, pain  med, pcp/gi follow up.            Suzi Roots, MD 04/14/12 [phone]

## 2012-04-14 NOTE — ED Notes (Addendum)
Pt reports she was seen for same symptoms last week and also in July.  Pt unsure what her diagnosis was at the time. Pt had a CT that was normal.  Pt was given medication for stomach ulcers. Pt reports pain did not improve over the past week despite eating a bland diet and taking medication as prescribed. Pt reports several episodes of N/V over the week, dark tarry stool and generalized abdominal pain. Pt reports she is unable to follow up with other providers as she has insurance issues. Abdominal symptoms started Thursday of last week. Patient also reporting headache starting last night.

## 2012-04-14 NOTE — ED Notes (Signed)
MD at bedside. 

## 2012-04-14 NOTE — ED Notes (Signed)
The patient asked for water but the orders say NPO

## 2012-04-27 ENCOUNTER — Encounter (HOSPITAL_COMMUNITY): Payer: Self-pay | Admitting: Emergency Medicine

## 2012-04-27 ENCOUNTER — Emergency Department (HOSPITAL_COMMUNITY)
Admission: EM | Admit: 2012-04-27 | Discharge: 2012-04-27 | Disposition: A | Discharge status: Home / Self Care | Payer: Self-pay | Attending: Emergency Medicine | Admitting: Emergency Medicine

## 2012-04-27 DIAGNOSIS — G43709 Chronic migraine without aura, not intractable, without status migrainosus: Secondary | ICD-10-CM | POA: Insufficient documentation

## 2012-04-27 DIAGNOSIS — Z79899 Other long term (current) drug therapy: Secondary | ICD-10-CM | POA: Insufficient documentation

## 2012-04-27 DIAGNOSIS — Z7982 Long term (current) use of aspirin: Secondary | ICD-10-CM | POA: Insufficient documentation

## 2012-04-27 MED ORDER — PROMETHAZINE HCL 25 MG/ML IJ SOLN
25.0000 mg | Freq: Four times a day (QID) | INTRAMUSCULAR | Status: DC | PRN
  Administered 2012-04-27: 25 mg via INTRAVENOUS
  Filled 2012-04-27: qty 1

## 2012-04-27 MED ORDER — BUTORPHANOL TARTRATE 2 MG/ML IJ SOLN
4.0000 mg | Freq: Once | INTRAMUSCULAR | Status: AC
  Administered 2012-04-27: 4 mg via INTRAVENOUS
  Filled 2012-04-27: qty 2

## 2012-04-27 MED ORDER — PROMETHAZINE HCL 25 MG PO TABS: 25.0000 mg | ORAL_TABLET | Freq: Four times a day (QID) | ORAL | Status: DC | PRN

## 2012-04-27 MED ORDER — KETOROLAC TROMETHAMINE 15 MG/ML IJ SOLN
15.0000 mg | Freq: Once | INTRAMUSCULAR | Status: AC
  Administered 2012-04-27: 15 mg via INTRAVENOUS
  Filled 2012-04-27: qty 1

## 2012-04-27 MED ORDER — SUMATRIPTAN SUCCINATE 6 MG/0.5ML ~~LOC~~ SOLN
6.0000 mg | Freq: Once | SUBCUTANEOUS | Status: AC
  Administered 2012-04-27: 6 mg via SUBCUTANEOUS
  Filled 2012-04-27: qty 0.5

## 2012-04-27 MED ORDER — SODIUM CHLORIDE 0.9 % IV BOLUS (SEPSIS)
500.0000 mL | Freq: Once | INTRAVENOUS | Status: AC
  Administered 2012-04-27: 500 mL via INTRAVENOUS

## 2012-04-27 MED ORDER — BUTORPHANOL TARTRATE 10 MG/ML NA SOLN
1.0000 | NASAL | Status: DC | PRN
Start: 2012-04-27 — End: 2012-06-23

## 2012-04-27 MED ORDER — PROMETHAZINE HCL 25 MG/ML IJ SOLN
25.0000 mg | Freq: Once | INTRAMUSCULAR | Status: AC
  Administered 2012-04-27: 25 mg via INTRAVENOUS
  Filled 2012-04-27: qty 1

## 2012-04-27 NOTE — ED Notes (Signed)
Pt requesting additional Stadol and Phenergan. Will inform MD Bonk

## 2012-04-27 NOTE — ED Notes (Signed)
Pt sts she has chronic migraines and feels she is experiencing one at this time. Pt experiencing photophobia and nausea.

## 2012-04-27 NOTE — ED Provider Notes (Signed)
History     CSN: [id number]  Arrival date & time 04/27/12  1523   First MD Initiated Contact with Patient 04/27/12 1544      Chief Complaint  Patient presents with  . Headache    (Consider location/radiation/quality/duration/timing/severity/associated sxs/prior treatment) HPILaura Luster Landsberg Chen is a 46 y.o. female a long history of migraine headaches as well as anxiety and depression who is currently been out of all of her medications because she cannot see her neurologist anymore, she has back bills and he will not see her until she is patent full. She is supposed to be taking Lamictal, Zoloft and Ambien.  In addition she uses Stadol at home for migraine headache abortion - but is out of this medication as well. Also recently diagnosed with GERD versus PUD and has been put on Protonix and Carafate.  Patient has a typical migraine headache which is 9/10 is located behind the left eye, and left frontal temporal region, it is throbbing, stabbing, associated with nausea, photophobia and phonophobia, no aura, no numbness tingling, weakness. She has no focal neurologic deficits and denies any fevers, chills, chest pain, shortness of breath, ankle swelling, myalgias, rash   Past Medical History  Diagnosis Date  . Depression   . Migraine   . Anxiety     History reviewed. No pertinent past surgical history.  Family History  Problem Relation Age of Onset  . Hypertension Father   . Cancer Mother     History  Substance Use Topics  . Smoking status: Never Smoker   . Smokeless tobacco: Never Used  . Alcohol Use: Yes     rarely    OB History    Grav Para Term Preterm Abortions TAB SAB Ect Mult Living                  Review of Systems At least 10pt or greater review of systems completed and are negative except where specified in the HPI.  Allergies  Benadryl and Compazine  Home Medications   Current Outpatient Rx  Name Route Sig Dispense Refill  .  ASPIRIN-ACETAMINOPHEN-CAFFEINE 500-325-65 MG PO PACK Oral Take 2 packets by mouth every 6 (six) hours as needed. Pain    . CALCIUM CARBONATE ANTACID 500 MG PO CHEW Oral Chew 1 tablet by mouth 3 (three) times daily as needed. For indigestion.    Marland Kitchen LAMOTRIGINE 100 MG PO TABS Oral Take 150 mg by mouth 2 (two) times daily.     Marland Kitchen PANTOPRAZOLE SODIUM 40 MG PO TBEC Oral Take 40 mg by mouth daily.    . SERTRALINE HCL 100 MG PO TABS Oral Take 100 mg by mouth 2 (two) times daily.    . SUCRALFATE 1 G PO TABS Oral Take 1 tablet (1 g total) by mouth 4 (four) times daily. 28 tablet 0  . ZOLPIDEM TARTRATE 10 MG PO TABS Oral Take 10 mg by mouth at bedtime.       BP 145/84  Pulse 106  Temp 99.2 F (37.3 C)  Resp 16  SpO2 100%  LMP 04/27/2012  Physical Exam  PHYSICAL EXAM: VITAL SIGNS:  . Filed Vitals:   04/27/12 1534  BP: 145/84  Pulse: 106  Temp: 99.2 F (37.3 C)  Resp: 16  SpO2: 100%   CONSTITUTIONAL: Awake, oriented, appears non-toxic HENT: Atraumatic, normocephalic, oral mucosa pink and moist, airway patent. Nares patent without drainage. External ears normal. EYES: Conjunctiva clear, EOMI, PERRLA NECK: Trachea midline, non-tender, supple CARDIOVASCULAR: Normal heart rate, Normal  rhythm, No murmurs, rubs, gallops PULMONARY/CHEST: Clear to auscultation, no rhonchi, wheezes, or rales. Symmetrical breath sounds. CHEST WALL: No lesions. Non-tender. ABDOMINAL: Non-distended, soft, non-tender - no rebound or guarding.  BS normal. NEUROLOGIC: [id number] sensation equal to light touch bilaterally.  Good muscle bulk in the masseter muscle and good lateral movement of the jaw.  Facial expressions equal and good strength with smile/frown and puffed cheeks.  Hearing grossly intact to finger rub test.  Uvula, tongue are midline with no deviation. Symmetrical palate elevation.  Trapezius and SCM muscles are 5/5 strength bilaterally.   DTR: Brachioradialis, biceps, patellar, Achilles tendon reflexes 2+  bilaterally.  No clonus. Strength: 5/5 strength flexors and extensors in the upper and lower extremities.  Grip strength, finger adduction/abduction 5/5. Sensation: Sensation intact distally to light touch Cerebellar: No ataxia with walking or dysmetria with finger to nose, rapid alternating hand movements and heels to shin testing. EXTREMITIES: No clubbing, cyanosis, or edema SKIN: Warm, Dry, No erythema, No rash  ED Course  Procedures (including critical care time)  Labs Reviewed - No data to display No results found.   No diagnosis found.    MDM  Maureen Chen is a 46 y.o. female presenting with one of her typical migraine headaches. The patient does not appear that uncomfortable to me, she is not requesting narcotics by her typical migraine cocktail which consists of 60 mg of Toradol, 4 mg of Stadol and 25 mg of Phenergan. She apparently has akathisia with Compazine but does not react to Phenergan.  Do not think the patient has meningitis or encephalitis at this time, also not suspicious for subarachnoid hemorrhage. She's had no trauma, and his headache character is more consistent with her typical migraines versus worst headache of her life with rapid onset.  I discussed using 15 mg of Toradol, and will use the rest of her migraine cocktail to abort headache.  04/27/2012 7:05 PM RN reports that patient is requesting more pain medicine- stadol and Phenergan. She says the Imitrex did nothing to help her headache.  04/27/2012 7:53 PM Patient reevaluated 30 minutes after last Stadol dose-patient is feeling much better and has only residual headache in the left temple, pain is minimal and tolerable, she wishes to go home. Will refill her Stadol prescription as well as Phenergan-she'll be given resource guide to finding a new primary care physician.  I explained the diagnosis and have given explicit precautions to return to the ER including worsening headache despite medications,  vomiting and ability to keep down fluids or any other new or worsening symptoms. The patient understands and accepts the medical plan as it's been dictated and I have answered their questions. Discharge instructions concerning home care and prescriptions have been given.  The patient is STABLE and is discharged to home in good condition.           Jones Skene, MD 04/27/12 1954

## 2012-04-27 NOTE — ED Notes (Signed)
Reports headache onset 1830hrs last night went to bed around 2030 & treated self with goody tablets. However, woke up with a headache this am. +nausea, has not eaten anything today, +photophbia.

## 2012-05-02 ENCOUNTER — Emergency Department (HOSPITAL_COMMUNITY)
Admission: EM | Admit: 2012-05-02 | Discharge: 2012-05-02 | Disposition: A | Discharge status: Home / Self Care | Payer: Self-pay | Attending: Emergency Medicine | Admitting: Emergency Medicine

## 2012-05-02 ENCOUNTER — Encounter (HOSPITAL_COMMUNITY): Payer: Self-pay | Admitting: Emergency Medicine

## 2012-05-02 DIAGNOSIS — Z79899 Other long term (current) drug therapy: Secondary | ICD-10-CM | POA: Insufficient documentation

## 2012-05-02 DIAGNOSIS — G43709 Chronic migraine without aura, not intractable, without status migrainosus: Secondary | ICD-10-CM | POA: Insufficient documentation

## 2012-05-02 MED ORDER — BUTORPHANOL TARTRATE 2 MG/ML IJ SOLN
2.0000 mg | Freq: Once | INTRAMUSCULAR | Status: AC
  Administered 2012-05-02: 2 mg via INTRAVENOUS
  Filled 2012-05-02: qty 1

## 2012-05-02 MED ORDER — SODIUM CHLORIDE 0.9 % IV BOLUS (SEPSIS)
1000.0000 mL | Freq: Once | INTRAVENOUS | Status: AC
  Administered 2012-05-02: 1000 mL via INTRAVENOUS

## 2012-05-02 MED ORDER — KETOROLAC TROMETHAMINE 30 MG/ML IJ SOLN
30.0000 mg | Freq: Once | INTRAMUSCULAR | Status: AC
  Administered 2012-05-02: 30 mg via INTRAVENOUS
  Filled 2012-05-02: qty 1

## 2012-05-02 MED ORDER — PROMETHAZINE HCL 25 MG/ML IJ SOLN
25.0000 mg | Freq: Once | INTRAMUSCULAR | Status: AC
  Administered 2012-05-02: 25 mg via INTRAVENOUS
  Filled 2012-05-02 (×2): qty 1

## 2012-05-02 MED ORDER — BUTORPHANOL TARTRATE 2 MG/ML IJ SOLN
4.0000 mg | Freq: Once | INTRAMUSCULAR | Status: AC
  Administered 2012-05-02: 4 mg via INTRAVENOUS
  Filled 2012-05-02: qty 2

## 2012-05-02 NOTE — ED Notes (Addendum)
Pt sts she was recently here with a migraine and only Stadol helps.  Denies n/v.  Sts light and sound increases pain level.  Pt sts she can not afford prescribed medication.

## 2012-05-02 NOTE — ED Notes (Signed)
[id number]> Expected date:<BR> Expected time:<BR> Means of arrival:<BR> Comments:<BR> Triage 3

## 2012-05-02 NOTE — ED Provider Notes (Signed)
History     CSN: [id number]  Arrival date & time 05/02/12  1517   First MD Initiated Contact with Patient 05/02/12 1628      Chief Complaint  Patient presents with  . Migraine    (Consider location/radiation/quality/duration/timing/severity/associated sxs/prior treatment) HPI Pt with long history of chronic migraines reports onset of typical migraine this morning. Similar to numerous previous ED visits. Has had difficulty with outpatient control due to losing her job and not being able to afford her meds or Neurologist appointments. Pain is diffuse throbbing, associated with photophobia and nausea.   Past Medical History  Diagnosis Date  . Depression   . Migraine   . Anxiety     History reviewed. No pertinent past surgical history.  Family History  Problem Relation Age of Onset  . Hypertension Father   . Cancer Mother     History  Substance Use Topics  . Smoking status: Never Smoker   . Smokeless tobacco: Never Used  . Alcohol Use: Yes     rarely    OB History    Grav Para Term Preterm Abortions TAB SAB Ect Mult Living                  Review of Systems All other systems reviewed and are negative except as noted in HPI.   Allergies  Benadryl and Compazine  Home Medications   Current Outpatient Rx  Name Route Sig Dispense Refill  . ASPIRIN-ACETAMINOPHEN-CAFFEINE 500-325-65 MG PO PACK Oral Take 2 packets by mouth every 6 (six) hours as needed. Pain    . BUTORPHANOL TARTRATE 10 MG/ML NA SOLN Nasal Place 1 spray into the nose every 4 (four) hours as needed for pain. 2.5 mL 0  . CALCIUM CARBONATE ANTACID 500 MG PO CHEW Oral Chew 1 tablet by mouth 3 (three) times daily as needed. For indigestion.    Marland Kitchen LAMOTRIGINE 100 MG PO TABS Oral Take 150 mg by mouth 2 (two) times daily.     Marland Kitchen PANTOPRAZOLE SODIUM 40 MG PO TBEC Oral Take 40 mg by mouth daily.    Marland Kitchen PROMETHAZINE HCL 25 MG PO TABS Oral Take 1 tablet (25 mg total) by mouth every 6 (six) hours as needed for  nausea. 30 tablet 0  . SERTRALINE HCL 100 MG PO TABS Oral Take 100 mg by mouth 2 (two) times daily.    . SUCRALFATE 1 G PO TABS Oral Take 1 tablet (1 g total) by mouth 4 (four) times daily. 28 tablet 0  . ZOLPIDEM TARTRATE 10 MG PO TABS Oral Take 10 mg by mouth at bedtime.       BP 142/78  Pulse 113  Temp 98.7 F (37.1 C) (Oral)  Resp 20  Ht 5\' 7"  (1.702 m)  Wt 210 lb (95.255 kg)  BMI 32.89 kg/m2  SpO2 100%  LMP 04/27/2012  Physical Exam  Nursing note and vitals reviewed. Constitutional: She is oriented to person, place, and time. She appears well-developed and well-nourished.  HENT:  Head: Normocephalic and atraumatic.  Eyes: EOM are normal. Pupils are equal, round, and reactive to light.  Neck: Normal range of motion. Neck supple.  Cardiovascular: Normal rate, normal heart sounds and intact distal pulses.   Pulmonary/Chest: Effort normal and breath sounds normal.  Abdominal: Bowel sounds are normal. She exhibits no distension. There is no tenderness.  Musculoskeletal: Normal range of motion. She exhibits no edema and no tenderness.  Neurological: She is alert and oriented to person, place, and time.  She has normal strength. No cranial nerve deficit or sensory deficit.  Skin: Skin is warm and dry. No rash noted.  Psychiatric: She has a normal mood and affect.    ED Course  Procedures (including critical care time)  Labs Reviewed - No data to display No results found.   No diagnosis found.    MDM  Pt feeling better with Stadol, Toradol and Phenergan. Ready to go home.         Charles B. Bernette Mayers, MD 05/02/12 Serena Croissant

## 2012-05-02 NOTE — ED Notes (Signed)
Patient states she has a h/o migraines but lost her job and cannot afford her medication.  Patient began having a migraine this morning around 0330.  Patient c/o pain, dizziness, light sensitivity, and nausea.

## 2012-05-15 ENCOUNTER — Encounter (HOSPITAL_COMMUNITY): Payer: Self-pay | Admitting: Emergency Medicine

## 2012-05-15 ENCOUNTER — Emergency Department (HOSPITAL_COMMUNITY)
Admission: EM | Admit: 2012-05-15 | Discharge: 2012-05-15 | Disposition: A | Discharge status: Home / Self Care | Payer: Self-pay | Attending: Emergency Medicine | Admitting: Emergency Medicine

## 2012-05-15 ENCOUNTER — Other Ambulatory Visit: Payer: Self-pay | Admitting: Internal Medicine

## 2012-05-15 ENCOUNTER — Emergency Department (HOSPITAL_COMMUNITY)
Admission: EM | Admit: 2012-05-15 | Discharge: 2012-05-16 | Disposition: A | Discharge status: Home / Self Care | Payer: Self-pay | Attending: Emergency Medicine | Admitting: Emergency Medicine

## 2012-05-15 ENCOUNTER — Encounter (HOSPITAL_COMMUNITY)
Admission: EM | Disposition: A | Discharge status: Home / Self Care | Payer: Self-pay | Source: Home / Self Care | Attending: Emergency Medicine

## 2012-05-15 DIAGNOSIS — F3289 Other specified depressive episodes: Secondary | ICD-10-CM | POA: Insufficient documentation

## 2012-05-15 DIAGNOSIS — Z79899 Other long term (current) drug therapy: Secondary | ICD-10-CM | POA: Insufficient documentation

## 2012-05-15 DIAGNOSIS — K269 Duodenal ulcer, unspecified as acute or chronic, without hemorrhage or perforation: Secondary | ICD-10-CM

## 2012-05-15 DIAGNOSIS — Q391 Atresia of esophagus with tracheo-esophageal fistula: Secondary | ICD-10-CM

## 2012-05-15 DIAGNOSIS — R12 Heartburn: Secondary | ICD-10-CM | POA: Insufficient documentation

## 2012-05-15 DIAGNOSIS — G43909 Migraine, unspecified, not intractable, without status migrainosus: Secondary | ICD-10-CM | POA: Insufficient documentation

## 2012-05-15 DIAGNOSIS — R1310 Dysphagia, unspecified: Secondary | ICD-10-CM | POA: Insufficient documentation

## 2012-05-15 DIAGNOSIS — K222 Esophageal obstruction: Secondary | ICD-10-CM

## 2012-05-15 DIAGNOSIS — F411 Generalized anxiety disorder: Secondary | ICD-10-CM | POA: Insufficient documentation

## 2012-05-15 DIAGNOSIS — R1319 Other dysphagia: Secondary | ICD-10-CM

## 2012-05-15 DIAGNOSIS — R1314 Dysphagia, pharyngoesophageal phase: Secondary | ICD-10-CM

## 2012-05-15 DIAGNOSIS — F329 Major depressive disorder, single episode, unspecified: Secondary | ICD-10-CM | POA: Insufficient documentation

## 2012-05-15 DIAGNOSIS — K449 Diaphragmatic hernia without obstruction or gangrene: Secondary | ICD-10-CM | POA: Insufficient documentation

## 2012-05-15 DIAGNOSIS — R1013 Epigastric pain: Secondary | ICD-10-CM

## 2012-05-15 HISTORY — PX: ESOPHAGOGASTRODUODENOSCOPY: SHX5428

## 2012-05-15 SURGERY — EGD (ESOPHAGOGASTRODUODENOSCOPY)
Anesthesia: Moderate Sedation

## 2012-05-15 MED ORDER — PANTOPRAZOLE SODIUM 40 MG PO TBEC: 40.0000 mg | DELAYED_RELEASE_TABLET | Freq: Two times a day (BID) | ORAL | Status: DC

## 2012-05-15 MED ORDER — BUTORPHANOL TARTRATE 2 MG/ML IJ SOLN
2.0000 mg | Freq: Once | INTRAMUSCULAR | Status: AC
  Administered 2012-05-16: 2 mg via INTRAVENOUS
  Filled 2012-05-15: qty 1

## 2012-05-15 MED ORDER — FENTANYL CITRATE 0.05 MG/ML IJ SOLN
INTRAMUSCULAR | Status: DC | PRN
  Administered 2012-05-15: 25 ug via INTRAVENOUS
  Administered 2012-05-15: 50 ug via INTRAVENOUS
  Administered 2012-05-15 (×2): 25 ug via INTRAVENOUS

## 2012-05-15 MED ORDER — GLUCAGON HCL (RDNA) 1 MG IJ SOLR
1.0000 mg | Freq: Once | INTRAMUSCULAR | Status: AC
  Administered 2012-05-15: 1 mg via INTRAVENOUS
  Filled 2012-05-15: qty 1

## 2012-05-15 MED ORDER — KETOROLAC TROMETHAMINE 30 MG/ML IJ SOLN: 30.0000 mg | Freq: Once | INTRAMUSCULAR | Status: DC

## 2012-05-15 MED ORDER — MIDAZOLAM HCL 10 MG/2ML IJ SOLN
INTRAMUSCULAR | Status: DC | PRN
  Administered 2012-05-15 (×3): 2 mg via INTRAVENOUS
  Administered 2012-05-15 (×2): 1 mg via INTRAVENOUS
  Administered 2012-05-15: 2 mg via INTRAVENOUS

## 2012-05-15 MED ORDER — SODIUM CHLORIDE 0.9 % IV SOLN: INTRAVENOUS | Status: DC

## 2012-05-15 MED ORDER — FENTANYL CITRATE 0.05 MG/ML IJ SOLN
INTRAMUSCULAR | Status: AC
  Filled 2012-05-15: qty 4

## 2012-05-15 MED ORDER — BUTAMBEN-TETRACAINE-BENZOCAINE 2-2-14 % EX AERO
INHALATION_SPRAY | CUTANEOUS | Status: DC | PRN
  Administered 2012-05-15: 2 via TOPICAL

## 2012-05-15 MED ORDER — PROMETHAZINE HCL 25 MG/ML IJ SOLN
12.5000 mg | Freq: Once | INTRAMUSCULAR | Status: AC
  Administered 2012-05-16: 12.5 mg via INTRAVENOUS
  Filled 2012-05-15: qty 1

## 2012-05-15 MED ORDER — MIDAZOLAM HCL 10 MG/2ML IJ SOLN
INTRAMUSCULAR | Status: AC
  Filled 2012-05-15: qty 4

## 2012-05-15 NOTE — ED Notes (Signed)
Per Endo Stacy,rn, pt was seen prior for food impaction sent to Endo, results showed nothing stuck in throat and severe infection of duodenum.  A biopsy of stomach and esophageal ring was taken.  Was given of fentanyl and 10 mg of versed.  Pt currently c/o headache.

## 2012-05-15 NOTE — ED Notes (Signed)
Pt presents after eating cube steak, pt felt it "get hung", pt still feels like the piece of meat is hung in her her throat, pt states that she has had trouble swallowing for a couple of years but has not gotten it checked out because she does not have insurance.

## 2012-05-15 NOTE — ED Notes (Signed)
Pt transferred to endoscopy

## 2012-05-15 NOTE — Op Note (Signed)
[facility] [address] [place], [address]   ENDOSCOPY PROCEDURE REPORT  PATIENT: Janiylah, Hannis  MR#: [id number] BIRTHDATE: [birth year] , 46  yrs. old GENDER: Female ENDOSCOPIST: Beverley Fiedler, MD REFERRED BY:  Adela Lank PROCEDURE DATE:  05/15/2012 PROCEDURE:  EGD w/ biopsy and EGD w/ biopsy for H.pylori ASA CLASS:     Class III INDICATIONS:  dysphagia.   heartburn.   epigastric pain. MEDICATIONS: These medications were titrated to patient response per physician's verbal order, Fentanyl-Detailed 125 mg IV, and Versed 10 mg IV TOPICAL ANESTHETIC: Cetacaine Spray  DESCRIPTION OF PROCEDURE: After the risks benefits and alternatives of the procedure were thoroughly explained, informed consent was obtained.  The Pentax Gastroscope [id number] endoscope was introduced through the mouth and advanced to the bulb of duodenum. Without limitations.  The instrument was slowly withdrawn as the mucosa was fully examined.    ESOPHAGUS: A moderately severe Schatzki ring was found 34 cm from the incisors.  There was some mild resistance to passage of the standard adult upper endoscope.  Once the scope passed there was a small, shallow mucosal tear.  Multiple biopsies were performed using cold forceps.  Sample sent for histology.   The esophagus was otherwise normal.   A 4 cm hiatal hernia was noted.  STOMACH: The mucosa of the stomach appeared normal.  Biopsies were taken in the gastric body, antrum and angularis.  DUODENUM: Severe duodenal inflammation was found in the duodenal bulb.   Multiple non-bleeding shallow ulcers, ranging between 3-45mm in size, one with a pigmented spot was found in the duodenal bulb. There is a peptic narrowing at the junction of the bulb and 2nd part of the duodenum.  This area was unable to be traversed.  Retroflexed views revealed a hiatal hernia (see above). The scope was then withdrawn from the patient and the  procedure completed.  COMPLICATIONS: There were no complications.  ENDOSCOPIC IMPRESSION: 1.   Schatzki ring was found 34 cm from the incisors; multiple biopsies 2.   The esophagus was otherwise normal. 3.   4 cm hiatal hernia 4.   The mucosa of the stomach appeared normal; biopsies were taken in the antrum and angularis 5.   Duodenal inflammation was found in the duodenal bulb with luminal narrowing 6.   Multiple non-bleeding ulcers, ranging between 3-51mm in size, were found in the duodenal bulb  RECOMMENDATIONS: 1.  Await pathology results 2.  Strict avoidance of NSAIDS 3.  Recommend pantoprazole 40 mg twice daily for at least 8 weeks 4.  Follow-up of helicobacter pylori status, treat if indicated 5.  May require repeat EGD for dilation if dysphagia persists 6.  Office follow-up in 4-6 weeks  eSigned:  Beverley Fiedler, MD 05/15/2012 8:53 PM   CC:The Patient  PATIENT NAME:  Fran, Neiswonger MR#: [id number]

## 2012-05-15 NOTE — ED Provider Notes (Signed)
History    46 year old female presenting with foreign body sensation in her throat. Inability to swallow. Onset was while eating cube steak shortly before arrival. Patient felt like something "got hung up." Been unable to swallow since then. No difficulty breathing. no stridor. Patient reports a history of "food sticking" for several years but has never had formal evaluation for this. She has always been able to cough it back up previously.   CSN: [id number]  Arrival date & time 05/15/12  1724   First MD Initiated Contact with Patient 05/15/12 1755      Chief Complaint  Patient presents with  . Choking  . Shortness of Breath    (Consider location/radiation/quality/duration/timing/severity/associated sxs/prior treatment) HPI  Past Medical History  Diagnosis Date  . Depression   . Migraine   . Anxiety     History reviewed. No pertinent past surgical history.  Family History  Problem Relation Age of Onset  . Hypertension Father   . Cancer Mother     History  Substance Use Topics  . Smoking status: Never Smoker   . Smokeless tobacco: Never Used  . Alcohol Use: Yes     rarely    OB History    Grav Para Term Preterm Abortions TAB SAB Ect Mult Living                  Review of Systems   Review of symptoms negative unless otherwise noted in HPI.   Allergies  Benadryl and Compazine  Home Medications   Current Outpatient Rx  Name Route Sig Dispense Refill  . ASPIRIN-ACETAMINOPHEN-CAFFEINE 500-325-65 MG PO PACK Oral Take 2 packets by mouth every 6 (six) hours as needed. Pain    . BUTORPHANOL TARTRATE 10 MG/ML NA SOLN Nasal Place 1 spray into the nose every 4 (four) hours as needed for pain. 2.5 mL 0  . CALCIUM CARBONATE ANTACID 500 MG PO CHEW Oral Chew 1 tablet by mouth 3 (three) times daily as needed. For indigestion.    . SERTRALINE HCL 100 MG PO TABS Oral Take 100 mg by mouth 2 (two) times daily.    Marland Kitchen ZOLPIDEM TARTRATE 10 MG PO TABS Oral Take 10 mg by mouth at  bedtime.       SpO2 99%  LMP 04/27/2012  Physical Exam  Nursing note and vitals reviewed. Constitutional: She appears well-developed and well-nourished. No distress.       Sitting up in bed. No distress.  HENT:  Head: Normocephalic and atraumatic.  Mouth/Throat: Oropharynx is clear and moist.       Posterior pharynx is clear. Neck is supple. There is no stridor. Despite stating that she cannot swallow patient does appear to be handling her secretions okay.  Eyes: Conjunctivae normal are normal. Right eye exhibits no discharge. Left eye exhibits no discharge.  Neck: Neck supple.  Cardiovascular: Normal rate, regular rhythm and normal heart sounds.  Exam reveals no gallop and no friction rub.   No murmur heard. Pulmonary/Chest: Effort normal and breath sounds normal. No respiratory distress.  Abdominal: Soft. She exhibits no distension. There is no tenderness.  Musculoskeletal: She exhibits no edema and no tenderness.  Neurological: She is alert.  Skin: Skin is warm and dry.  Psychiatric: She has a normal mood and affect. Her behavior is normal. Thought content normal.    ED Course  Procedures (including critical care time)  Labs Reviewed - No data to display No results found.   1. Esophageal dysphagia   2. Dyspepsia  3. Epigastric abdominal pain   4. Duodenal ulcer disease   5. Schatzki's ring       MDM  46 year old female with likely esophageal food impaction. IV established. Glucagon given. Consulted GI for evaluation.        Raeford Razor, MD 05/16/12 [phone]

## 2012-05-15 NOTE — Consult Note (Signed)
SUBJECTIVE: HPI [name] is a 46 yo female with PMH of migraines, anxiety and depression who presents to the ED tonight complaining of acute dysphagia.  Reports ongoing hx of intermittent solid food dysphagia, but always able to regurgitate food when it "sticks".  Could not do so tonight after eating meat.  Could not tolerate secretions on arrival.  Now a bit better, but something still feels "hung in throat".  Recent hx of epigastric pain, worse after eating and nausea.  No vomiting.  Some heartburn.  No odynophagia.  No wt loss.  No melena or BRBPR.  Was put on PPI and carafate after a recent ED visit and this helped, but she ran out of this medication and symptoms returned.  No prior hx of EGD.    Freq ED visits for migraines.  Lost job and medical insurance earlier in the year so she has been unable to afford office visits with PCP/specialists.  Review of Systems  As per history of present illness, otherwise negative   Past Medical History  Diagnosis Date  . Depression   . Migraine   . Anxiety     Current Facility-Administered Medications  Medication Dose Route Frequency Provider Last Rate Last Dose  . 0.9 %  sodium chloride infusion   Intravenous Continuous Beverley Fiedler, MD      . glucagon (GLUCAGEN) injection 1 mg  1 mg Intravenous Once Raeford Razor, MD   1 mg at 05/15/12 1839    Allergies  Allergen Reactions  . Benadryl (Diphenhydramine Hcl)     Causes shaking  . Compazine Anxiety    Family History  Problem Relation Age of Onset  . Hypertension Father   . Cancer Mother     History  Substance Use Topics  . Smoking status: Never Smoker   . Smokeless tobacco: Never Used  . Alcohol Use: Yes     rarely    OBJECTIVE: BP 132/74  Pulse 92  Temp 98.8 F (37.1 C) (Oral)  Resp 17  SpO2 97%  LMP 04/27/2012 Constitutional: Well-developed and well-nourished. No distress. HEENT: Normocephalic and atraumatic. Oropharynx is clear and moist. No oropharyngeal exudate.  Conjunctivae are normal. No scleral icterus. Neck: Neck supple. Trachea midline. Cardiovascular: Normal rate, regular rhythm and intact distal pulses. No M/R/G Pulmonary/chest: Effort normal and breath sounds normal. No wheezing, rales or rhonchi. Abdominal: Soft, obese, mild epigastric tenderness without rebound or guarding, nondistended. Bowel sounds active throughout. Extremities: no clubbing, cyanosis, with trace pretib edema. Lymphadenopathy: No cervical adenopathy noted. Neurological: Alert and oriented to person place and time. Skin: Skin is warm and dry. No rashes noted. Psychiatric: Normal mood and affect. Behavior is normal.  Labs and Imaging -- CBC    Component Value Date/Time   WBC 13.9* 04/14/2012 1306   RBC 4.55 04/14/2012 1306   HGB 12.7 04/14/2012 1306   HCT 38.6 04/14/2012 1306   PLT 459* 04/14/2012 1306   MCV 84.8 04/14/2012 1306   MCH 27.9 04/14/2012 1306   MCHC 32.9 04/14/2012 1306   RDW 14.7 04/14/2012 1306   LYMPHSABS 2.8 04/14/2012 1306   MONOABS 1.2* 04/14/2012 1306   EOSABS 0.2 04/14/2012 1306   BASOSABS 0.1 04/14/2012 1306   CMP     Component Value Date/Time   NA 135 04/14/2012 1306   K 4.3 04/14/2012 1306   CL 101 04/14/2012 1306   CO2 22 04/14/2012 1306   GLUCOSE 92 04/14/2012 1306   BUN 7 04/14/2012 1306   CREATININE 0.57 04/14/2012  1306   CALCIUM 9.2 04/14/2012 1306   PROT 7.3 04/14/2012 1306   ALBUMIN 3.8 04/14/2012 1306   AST 18 04/14/2012 1306   ALT 10 04/14/2012 1306   ALKPHOS 62 04/14/2012 1306   BILITOT 0.3 04/14/2012 1306   GFRNONAA >90 04/14/2012 1306   GFRAA >90 04/14/2012 1306   Imaging, CT from 04/07/12 and [place] from 04/14/12 COMPLETE ABDOMINAL ULTRASOUND   Comparison:  CT abdomen pelvis dated 04/07/2012   Findings:   Gallbladder:  No gallstones, gallbladder wall thickening, or pericholecystic fluid.  Negative sonographic Murphy's sign.   Common bile duct:  Measures 4 mm.   Liver:  Hyperechoic hepatic parenchyma, suggesting hepatic steatosis.  No  focal hepatic lesion is seen.   IVC:  Appears normal.   Pancreas:  Incompletely visualized but grossly unremarkable.   Spleen:  Measures 4.9 cm.   Right Kidney:  Measures 10.7 cm.  No mass or hydronephrosis.   Left Kidney:  Measures 10.7 cm.  No mass or hydronephrosis.   Abdominal aorta:  No aneurysm identified.   IMPRESSION: Possible hepatic steatosis.   Otherwise negative abdominal ultrasound. CT ABDOMEN AND PELVIS WITH CONTRAST   Technique:  Multidetector CT imaging of the abdomen and pelvis was performed following the standard protocol during bolus administration of intravenous contrast.   Contrast: OMNIPAQUE IOHEXOL 300 MG/ML  SOLN   Comparison: No priors.   Findings:   Lung Bases: Tiny right-sided bipolar hernia is incidentally noted. Small hiatal hernia.   Abdomen/Pelvis:  The enhanced appearance of the liver, gallbladder, pancreas, spleen, bilateral adrenal glands and bilateral kidneys is unremarkable.   The appendix is normal (retrocecal in position).  No ascites or pneumoperitoneum and no pathologic distension of bowel.  No definite pathologic lymphadenopathy identified within the abdomen or pelvis.  Uterus, bilateral ovaries and urinary bladder are unremarkable in appearance.   Musculoskeletal: There are no aggressive appearing lytic or blastic lesions noted in the visualized portions of the skeleton.   IMPRESSION: 1.  No acute findings in the abdomen or pelvis to account for the patient's symptoms. 2.  Specifically, the appendix is normal. 3.  Small hiatal hernia.    ASSESSMENT AND PLAN:  46 yo female with PMH of migraines, anxiety and depression who presents to the ED tonight complaining of acute dysphagia  1.  Acute dysphagia -- now somewhat improved, and my suspicion for acute food impaction at present is low.  She likely has some residual irritation from having a food impaction which has now seemingly cleared.  She also could have  esophagitis, from reflux.  Will proceed to EGD tonight to ensure no food impaction. The nature of the procedure, as well as the risks, benefits, and alternatives were carefully and thoroughly reviewed with the patient. Ample time for discussion and questions allowed. The patient understood, was satisfied, and agreed to proceed.   2.  Epigastric pain/dyspepsia -- PPI helped, this will be evaluated by EGD tonight (procedure being done for problem #1).  Further recs after procedure.

## 2012-05-16 ENCOUNTER — Encounter (HOSPITAL_COMMUNITY): Payer: Self-pay

## 2012-05-16 ENCOUNTER — Encounter (HOSPITAL_COMMUNITY): Payer: Self-pay | Admitting: Internal Medicine

## 2012-05-16 MED ORDER — BUTORPHANOL TARTRATE 2 MG/ML IJ SOLN
2.0000 mg | Freq: Once | INTRAMUSCULAR | Status: AC
  Administered 2012-05-16: 2 mg via INTRAVENOUS
  Filled 2012-05-16: qty 1

## 2012-05-16 MED ORDER — PANTOPRAZOLE SODIUM 20 MG PO TBEC: 20.0000 mg | DELAYED_RELEASE_TABLET | Freq: Every day | ORAL | Status: DC

## 2012-05-16 NOTE — ED Provider Notes (Signed)
History     CSN: [id number]  Arrival date & time 05/15/12  2126   First MD Initiated Contact with Patient 05/15/12 2327      Chief Complaint  Patient presents with  . Headache    (Consider location/radiation/quality/duration/timing/severity/associated sxs/prior treatment) Patient is a 46 y.o. female presenting with headaches. The history is provided by the patient and a parent. No language interpreter was used.  Headache  This is a recurrent problem. The current episode started 1 to 2 hours ago. The problem occurs constantly. The problem has been gradually worsening. The headache is associated with bright light and emotional stress. The pain is located in the bilateral region. The quality of the pain is described as throbbing. The pain is at a severity of 8/10. The pain is moderate. The pain does not radiate. Associated symptoms include nausea. Pertinent negatives include no fever, no near-syncope, no shortness of breath and no vomiting. She has tried nothing for the symptoms.  46yo female evaluated for foreign body tonight in the ER by Dr. Juleen China and Dr Rhea Belton GI and found to have Schatzki ring.  Patient developed a migraine h/a while in the procedure and returned to the ER for treatment.  No visual changes, fever, neck pain.  Neuro in tact.  Nausea with photophobia and phonophobia like her typical migraine h/a.     Past Medical History  Diagnosis Date  . Depression   . Migraine   . Anxiety     Past Surgical History  Procedure Date  . Dilation and curettage of uterus   . Esophagogastroduodenoscopy 05/15/2012    Procedure: ESOPHAGOGASTRODUODENOSCOPY (EGD);  Surgeon: Beverley Fiedler, MD;  Location: [facility];  Service: Gastroenterology;  Laterality: N/A;    Family History  Problem Relation Age of Onset  . Hypertension Father   . Cancer Mother     History  Substance Use Topics  . Smoking status: Never Smoker   . Smokeless tobacco: Never Used  . Alcohol Use: Yes     rarely      OB History    Grav Para Term Preterm Abortions TAB SAB Ect Mult Living                  Review of Systems  Constitutional: Negative.  Negative for fever.  Eyes: Negative.   Respiratory: Negative.  Negative for shortness of breath.   Cardiovascular: Negative.  Negative for near-syncope.  Gastrointestinal: Positive for nausea. Negative for vomiting.  Neurological: Positive for headaches. Negative for dizziness, syncope, facial asymmetry, speech difficulty, weakness and light-headedness.  Psychiatric/Behavioral: Negative.   All other systems reviewed and are negative.    Allergies  Benadryl and Compazine  Home Medications   Current Outpatient Rx  Name Route Sig Dispense Refill  . BUTORPHANOL TARTRATE 10 MG/ML NA SOLN Nasal Place 1 spray into the nose every 4 (four) hours as needed for pain. 2.5 mL 0  . CALCIUM CARBONATE ANTACID 500 MG PO CHEW Oral Chew 1 tablet by mouth 3 (three) times daily as needed. For indigestion.    Marland Kitchen PANTOPRAZOLE SODIUM 40 MG PO TBEC Oral Take 1 tablet (40 mg total) by mouth 2 (two) times daily before a meal. 60 tablet 3  . SERTRALINE HCL 100 MG PO TABS Oral Take 100 mg by mouth 2 (two) times daily.    Marland Kitchen ZOLPIDEM TARTRATE 10 MG PO TABS Oral Take 10 mg by mouth at bedtime.     Marland Kitchen PANTOPRAZOLE SODIUM 20 MG PO TBEC Oral Take 1  tablet (20 mg total) by mouth daily. 30 tablet 0    BP 94/76  Pulse 72  Resp 18  SpO2 98%  LMP 04/27/2012  Physical Exam  Nursing note and vitals reviewed. Constitutional: She is oriented to person, place, and time. She appears well-developed and well-nourished.  HENT:  Head: Normocephalic and atraumatic.  Eyes: Conjunctivae normal and EOM are normal. Pupils are equal, round, and reactive to light.  Neck: Normal range of motion. Neck supple.  Cardiovascular: Normal rate, regular rhythm, normal heart sounds and intact distal pulses.  Exam reveals no gallop and no friction rub.   No murmur heard. Pulmonary/Chest: Effort  normal and breath sounds normal.  Abdominal: Soft. Bowel sounds are normal.  Musculoskeletal: Normal range of motion. She exhibits no edema and no tenderness.  Neurological: She is alert and oriented to person, place, and time. She has normal strength and normal reflexes. No cranial nerve deficit or sensory deficit. GCS eye subscore is 4. GCS verbal subscore is 5. GCS motor subscore is 6.  Skin: Skin is warm and dry.  Psychiatric: She has a normal mood and affect.    ED Course  Procedures (including critical care time)  Labs Reviewed - No data to display No results found.   1. Migraine       MDM  Treated for her typical migraine h/a post Gi scope for ? Foreign body.  Good relief after 2 doses of stadol in the ER.  Neuro in tact.  No fever or neck pain.  Will follow up with her pcp tomorrow.  Patient understands to return for worsening symptoms.  Patient and her mother are ready for discharge. Labs Reviewed - No data to display        Remi Haggard, NP 05/16/12 1720

## 2012-05-16 NOTE — ED Provider Notes (Signed)
Medical screening examination/treatment/procedure(s) were performed by non-physician practitioner and as supervising physician I was immediately available for consultation/collaboration.  Sunnie Nielsen, MD 05/16/12 [phone]

## 2012-05-20 ENCOUNTER — Encounter (HOSPITAL_COMMUNITY): Payer: Self-pay

## 2012-05-20 ENCOUNTER — Encounter: Payer: Self-pay | Admitting: Internal Medicine

## 2012-05-20 ENCOUNTER — Emergency Department (HOSPITAL_COMMUNITY)
Admission: EM | Admit: 2012-05-20 | Discharge: 2012-05-20 | Disposition: A | Discharge status: Home / Self Care | Payer: Self-pay | Attending: Emergency Medicine | Admitting: Emergency Medicine

## 2012-05-20 DIAGNOSIS — G43909 Migraine, unspecified, not intractable, without status migrainosus: Secondary | ICD-10-CM | POA: Insufficient documentation

## 2012-05-20 DIAGNOSIS — F3289 Other specified depressive episodes: Secondary | ICD-10-CM | POA: Insufficient documentation

## 2012-05-20 DIAGNOSIS — K219 Gastro-esophageal reflux disease without esophagitis: Secondary | ICD-10-CM | POA: Insufficient documentation

## 2012-05-20 DIAGNOSIS — F411 Generalized anxiety disorder: Secondary | ICD-10-CM | POA: Insufficient documentation

## 2012-05-20 DIAGNOSIS — F329 Major depressive disorder, single episode, unspecified: Secondary | ICD-10-CM | POA: Insufficient documentation

## 2012-05-20 DIAGNOSIS — Z79899 Other long term (current) drug therapy: Secondary | ICD-10-CM | POA: Insufficient documentation

## 2012-05-20 MED ORDER — HYDROMORPHONE HCL PF 2 MG/ML IJ SOLN
2.0000 mg | Freq: Once | INTRAMUSCULAR | Status: AC
  Administered 2012-05-20: 2 mg via INTRAMUSCULAR
  Filled 2012-05-20 (×2): qty 1

## 2012-05-20 MED ORDER — PROMETHAZINE HCL 25 MG/ML IJ SOLN
25.0000 mg | Freq: Once | INTRAMUSCULAR | Status: DC
  Filled 2012-05-20: qty 1

## 2012-05-20 MED ORDER — BUTORPHANOL TARTRATE 2 MG/ML IJ SOLN
2.0000 mg | Freq: Once | INTRAMUSCULAR | Status: AC
  Administered 2012-05-20: 2 mg via INTRAMUSCULAR

## 2012-05-20 MED ORDER — GI COCKTAIL ~~LOC~~
30.0000 mL | Freq: Once | ORAL | Status: AC
  Administered 2012-05-20: 30 mL via ORAL
  Filled 2012-05-20: qty 30

## 2012-05-20 MED ORDER — PROMETHAZINE HCL 25 MG/ML IJ SOLN
25.0000 mg | Freq: Once | INTRAMUSCULAR | Status: AC
  Administered 2012-05-20: 25 mg via INTRAVENOUS
  Filled 2012-05-20: qty 1

## 2012-05-20 MED ORDER — KETOROLAC TROMETHAMINE 30 MG/ML IJ SOLN
60.0000 mg | Freq: Once | INTRAMUSCULAR | Status: AC
  Administered 2012-05-20: 60 mg via INTRAVENOUS
  Filled 2012-05-20: qty 2

## 2012-05-20 MED ORDER — BUTORPHANOL TARTRATE 2 MG/ML IJ SOLN
4.0000 mg | Freq: Once | INTRAMUSCULAR | Status: AC
  Administered 2012-05-20: 4 mg via INTRAVENOUS
  Filled 2012-05-20: qty 2

## 2012-05-20 MED ORDER — KETOROLAC TROMETHAMINE 30 MG/ML IJ SOLN
30.0000 mg | Freq: Once | INTRAMUSCULAR | Status: AC
  Administered 2012-05-20: 30 mg via INTRAMUSCULAR

## 2012-05-20 MED ORDER — PROMETHAZINE HCL 25 MG/ML IJ SOLN
25.0000 mg | Freq: Once | INTRAMUSCULAR | Status: AC
  Administered 2012-05-20: 25 mg via INTRAMUSCULAR
  Filled 2012-05-20: qty 1

## 2012-05-20 MED ORDER — KETOROLAC TROMETHAMINE 30 MG/ML IJ SOLN
30.0000 mg | Freq: Once | INTRAMUSCULAR | Status: DC
  Filled 2012-05-20: qty 1

## 2012-05-20 MED ORDER — BUTORPHANOL TARTRATE 2 MG/ML IJ SOLN
2.0000 mg | Freq: Once | INTRAMUSCULAR | Status: DC
  Filled 2012-05-20: qty 1

## 2012-05-20 NOTE — ED Provider Notes (Signed)
History     CSN: [id number]  Arrival date & time 05/20/12  4098   First MD Initiated Contact with Patient 05/20/12 1012      Chief Complaint  Patient presents with  . Migraine    (Consider location/radiation/quality/duration/timing/severity/associated sxs/prior treatment) HPI Comments: This is a 46 year old female, who presents to the ED with a chief complaint of migraine headache.  Patient states that the migraine began yesterday morning, and has been constant until now.  She has tried taking BC powder with little relief.  She has been seen here multiple times for the same complaint.  Her pain is 8/10.  She denies any MOI, focal neurologic deficit, or blurred vision.  She does endorse some photophobia and sensitivity to loud noises.  The history is provided by the patient. No language interpreter was used.    Past Medical History  Diagnosis Date  . Depression   . Migraine   . Anxiety     Past Surgical History  Procedure Date  . Dilation and curettage of uterus   . Esophagogastroduodenoscopy 05/15/2012    Procedure: ESOPHAGOGASTRODUODENOSCOPY (EGD);  Surgeon: Beverley Fiedler, MD;  Location: [facility];  Service: Gastroenterology;  Laterality: N/A;    Family History  Problem Relation Age of Onset  . Hypertension Father   . Cancer Mother     History  Substance Use Topics  . Smoking status: Never Smoker   . Smokeless tobacco: Never Used  . Alcohol Use: Yes     Comment: rarely    OB History    Grav Para Term Preterm Abortions TAB SAB Ect Mult Living                  Review of Systems  Gastrointestinal:       Reflux   Neurological: Positive for headaches.  All other systems reviewed and are negative.    Allergies  Benadryl and Compazine  Home Medications   Current Outpatient Rx  Name  Route  Sig  Dispense  Refill  . BUTORPHANOL TARTRATE 10 MG/ML NA SOLN   Nasal   Place 1 spray into the nose every 4 (four) hours as needed for pain.   2.5 mL   0   .  PANTOPRAZOLE SODIUM 40 MG PO TBEC   Oral   Take 1 tablet (40 mg total) by mouth 2 (two) times daily before a meal.   60 tablet   3   . SERTRALINE HCL 100 MG PO TABS   Oral   Take 100 mg by mouth every other day.          Marland Kitchen ZOLPIDEM TARTRATE 10 MG PO TABS   Oral   Take 10 mg by mouth at bedtime.            BP 118/68  Pulse 127  Temp 98.5 F (36.9 C) (Oral)  Resp 18  SpO2 100%  LMP 04/27/2012  Physical Exam  Nursing note and vitals reviewed. Constitutional: She is oriented to person, place, and time. She appears well-developed and well-nourished.  HENT:  Head: Normocephalic and atraumatic.  Eyes: Conjunctivae normal and EOM are normal. Pupils are equal, round, and reactive to light. Right eye exhibits no discharge. Left eye exhibits no discharge. No scleral icterus.  Neck: Normal range of motion. Neck supple.  Cardiovascular: Regular rhythm and normal heart sounds.  Exam reveals no gallop and no friction rub.   No murmur heard.      tachy  Pulmonary/Chest: Effort normal  and breath sounds normal. No respiratory distress. She has no wheezes. She has no rales. She exhibits no tenderness.  Abdominal: Soft. Bowel sounds are normal. She exhibits no distension and no mass. There is no tenderness. There is no rebound and no guarding.  Musculoskeletal: Normal range of motion.  Neurological: She is alert and oriented to person, place, and time.       CN 3-12 intact  Skin: Skin is warm and dry.  Psychiatric: She has a normal mood and affect. Her behavior is normal. Judgment and thought content normal.    ED Course  Procedures (including critical care time)  Labs Reviewed - No data to display No results found.   1. Migraine       MDM  46 year old female with migraine headache.  She has been seen here multiple times in the past.  I am going to give stadol, phenergan, and toradol.  Patient says that this works for her.   11:51 AM Re-evaluated the patient, she states  that she is improving after having her medications.  She states that the GI cocktail helped as well.   12:54 PM Patient states that her migraine is doing better, but is still not resolved.  I will order another round of her migraine cocktail.  1:34 PM Patient states that her pain is much better.  I am going to discharge the patient, and will have the patient establish PCP.  I have given her the resource guide, as well as discussed the new health insurance exchanges. VSS.  Roxy Horseman, PA-C 05/20/12 1335

## 2012-05-20 NOTE — ED Notes (Signed)
Unsuccessfully IV insertion attempt to LAC. Another RN to attempt for access.

## 2012-05-20 NOTE — ED Notes (Signed)
Patient c/o migraine headache. Patient has a history of migraine headaches. Patient had an endoscopy  5 days ago and was told not to take NSAIDS. Patient did noo have anything at home to take for migraine.

## 2012-05-20 NOTE — ED Provider Notes (Signed)
Medical screening examination/treatment/procedure(s) were performed by non-physician practitioner and as supervising physician I was immediately available for consultation/collaboration.   David H Yao, MD 05/20/12 1546 

## 2012-05-22 ENCOUNTER — Emergency Department (HOSPITAL_COMMUNITY)
Admission: EM | Admit: 2012-05-22 | Discharge: 2012-05-22 | Disposition: A | Discharge status: Home / Self Care | Payer: Self-pay | Attending: Emergency Medicine | Admitting: Emergency Medicine

## 2012-05-22 ENCOUNTER — Encounter (HOSPITAL_COMMUNITY): Payer: Self-pay | Admitting: Emergency Medicine

## 2012-05-22 DIAGNOSIS — R110 Nausea: Secondary | ICD-10-CM | POA: Insufficient documentation

## 2012-05-22 DIAGNOSIS — R51 Headache: Secondary | ICD-10-CM | POA: Insufficient documentation

## 2012-05-22 DIAGNOSIS — Z79899 Other long term (current) drug therapy: Secondary | ICD-10-CM | POA: Insufficient documentation

## 2012-05-22 DIAGNOSIS — F3289 Other specified depressive episodes: Secondary | ICD-10-CM | POA: Insufficient documentation

## 2012-05-22 DIAGNOSIS — Z8669 Personal history of other diseases of the nervous system and sense organs: Secondary | ICD-10-CM | POA: Insufficient documentation

## 2012-05-22 DIAGNOSIS — F329 Major depressive disorder, single episode, unspecified: Secondary | ICD-10-CM | POA: Insufficient documentation

## 2012-05-22 DIAGNOSIS — G8929 Other chronic pain: Secondary | ICD-10-CM | POA: Insufficient documentation

## 2012-05-22 DIAGNOSIS — F411 Generalized anxiety disorder: Secondary | ICD-10-CM | POA: Insufficient documentation

## 2012-05-22 MED ORDER — PROMETHAZINE HCL 25 MG/ML IJ SOLN
25.0000 mg | Freq: Once | INTRAMUSCULAR | Status: AC
  Administered 2012-05-22: 25 mg via INTRAMUSCULAR
  Filled 2012-05-22: qty 1

## 2012-05-22 MED ORDER — BUTORPHANOL TARTRATE 2 MG/ML IJ SOLN
4.0000 mg | Freq: Once | INTRAMUSCULAR | Status: AC
  Administered 2012-05-22: 4 mg via INTRAMUSCULAR
  Filled 2012-05-22 (×2): qty 1

## 2012-05-22 MED ORDER — BUTORPHANOL TARTRATE 2 MG/ML IJ SOLN: 4.0000 mg | Freq: Once | INTRAMUSCULAR | Status: DC

## 2012-05-22 MED ORDER — PROMETHAZINE HCL 25 MG RE SUPP: 25.0000 mg | Freq: Four times a day (QID) | RECTAL | Status: DC | PRN

## 2012-05-22 MED ORDER — KETOROLAC TROMETHAMINE 60 MG/2ML IM SOLN
60.0000 mg | Freq: Once | INTRAMUSCULAR | Status: AC
  Administered 2012-05-22: 60 mg via INTRAMUSCULAR
  Filled 2012-05-22: qty 2

## 2012-05-22 NOTE — Progress Notes (Signed)
[facility] ED Cm noted pt with 21 ED visits in last 6 months CM spoke with pt about visits Pt reports being out of work since [employer] 2013.  She was previously seeing Dr Carolin Coy about a year ago when she moved to [facility] She confirm no pcp at this time CM reviewed and discussed [place] self pay provider availability Provided list of self pay providers.  Pt reports owing chs and neurologist, dr Elayne Snare in [place] money. Pt states Dr Gwenevere Abbot office wants her to resolved her outstanding bill prior to her returning to the office Pt reports her mother as her support system.  Cm inquired if pt was aware of headache center Pt reports she did go to Cm provided information for financial and medication assistance (needymeds.org) resources plus contact information for health dept and DSS Encouraged needymeds.org for medication assistance after pt stated she if she does not get sleep medication she has headaches Pt reports getting medications also from [facility] outpatient pharmacy Pt reports having access to the Internet at her home and on her phone Pt voiced understanding and appreciation Cm noted pt informing ED RN which medication she needed and requesting medications to be given within a certain time frame Pt inquired about financial counselor and CM referred her to ED registration and the contact number listed on the chs bills mailed to her Cm inquired if pt interested in [place] application process she informed CM "no, I just need a job"

## 2012-05-22 NOTE — ED Notes (Signed)
Pt reports increased headache over last few hours. Reports onset of menses at same time

## 2012-05-22 NOTE — ED Provider Notes (Signed)
History     CSN: [id number]  Arrival date & time 05/22/12  1206   First MD Initiated Contact with Patient 05/22/12 1251      Chief Complaint  Patient presents with  . Migraine    pt reports headache x 12 hrs. Denies blurred vision. Intermittent nausea     HPI Pt was seen at 1325.  Per pt, c/o gradual onset and persistence of constant acute flair of her chronic migraine headache since this morning.  States her migraine trigger today was her menses starting.  Describes the headache as per her usual chronic migraine headache pain pattern for "30" years.  Has been associated with nausea.  Denies headache was sudden or maximal in onset or at any time.  Denies visual changes, no focal motor weakness, no tingling/numbness in extremities, no fevers, no neck pain, no rash.  The symptoms have been associated with no other complaints. The patient has a significant history of similar symptoms previously, recently being evaluated for this complaint and multiple prior evals for same.       Past Medical History  Diagnosis Date  . Depression   . Migraine   . Anxiety     Past Surgical History  Procedure Date  . Dilation and curettage of uterus   . Esophagogastroduodenoscopy 05/15/2012    Procedure: ESOPHAGOGASTRODUODENOSCOPY (EGD);  Surgeon: Beverley Fiedler, MD;  Location: [facility];  Service: Gastroenterology;  Laterality: N/A;    Family History  Problem Relation Age of Onset  . Hypertension Father   . Cancer Mother     History  Substance Use Topics  . Smoking status: Never Smoker   . Smokeless tobacco: Never Used  . Alcohol Use: Yes     Comment: rarely     Review of Systems ROS: Statement: All systems negative except as marked or noted in the HPI; Constitutional: Negative for fever and chills. ; ; Eyes: Negative for eye pain, redness and discharge. ; ; ENMT: Negative for ear pain, hoarseness, nasal congestion, sinus pressure and sore throat. ; ; Cardiovascular: Negative for chest  pain, palpitations, diaphoresis, dyspnea and peripheral edema. ; ; Respiratory: Negative for cough, wheezing and stridor. ; ; Gastrointestinal: +nausea. Negative for vomiting, diarrhea, abdominal pain, blood in stool, hematemesis, jaundice and rectal bleeding. . ; ; Genitourinary: Negative for dysuria, flank pain and hematuria. ; ; Musculoskeletal: Negative for back pain and neck pain. Negative for swelling and trauma.; ; Skin: Negative for pruritus, rash, abrasions, blisters, bruising and skin lesion.; ; Neuro: +headache. Negative for lightheadedness and neck stiffness. Negative for weakness, altered level of consciousness , altered mental status, extremity weakness, paresthesias, involuntary movement, seizure and syncope.       Allergies  Benadryl and Compazine  Home Medications   Current Outpatient Rx  Name  Route  Sig  Dispense  Refill  . BUTORPHANOL TARTRATE 10 MG/ML NA SOLN   Nasal   Place 1 spray into the nose every 4 (four) hours as needed for pain.   2.5 mL   0   . PANTOPRAZOLE SODIUM 40 MG PO TBEC   Oral   Take 40 mg by mouth 2 (two) times daily before a meal.         . ZOLPIDEM TARTRATE 10 MG PO TABS   Oral   Take 10 mg by mouth at bedtime.          . SERTRALINE HCL 100 MG PO TABS   Oral   Take 100 mg by mouth every  other day.            BP 124/96  Pulse 104  Temp 97.8 F (36.6 C) (Oral)  Resp 18  Wt 207 lb (93.895 kg)  SpO2 100%  LMP 05/22/2012  Physical Exam 1330: Physical examination:  Nursing notes reviewed; Vital signs and O2 SAT reviewed;  Constitutional: Well developed, Well nourished, Well hydrated, In no acute distress; Head:  Normocephalic, atraumatic; Eyes: EOMI, PERRL, No scleral icterus; ENMT: Mouth and pharynx normal, Mucous membranes moist; Neck: Supple, Full range of motion, No lymphadenopathy; Cardiovascular: Regular rate and rhythm, No murmur, rub, or gallop; Respiratory: Breath sounds clear & equal bilaterally, No rales, rhonchi, wheezes.   Speaking full sentences with ease, Normal respiratory effort/excursion; Chest: Nontender, Movement normal; Abdomen: Soft, Nontender, Nondistended, Normal bowel sounds; Genitourinary: No CVA tenderness; Extremities: Pulses normal, No tenderness, No edema, No calf edema or asymmetry.; Neuro: AA&Ox3, Major CN grossly intact.  Speech clear. Climbs on and off stretcher with ease. No gross focal motor or sensory deficits in extremities.; Skin: Color normal, Warm, Dry.   ED Course  Procedures    MDM  MDM Reviewed: previous chart, nursing note and vitals      1410:  Pt requesting stadol, phenergan, and toradol by names; states "it's the only thing that will work." Long hx of chronic pain with multiple ED visits for same, with 21 visits in the past 6 months; last ED visit was 2 days ago. Pt endorses acute flair of her usual long standing chronic pain today, no change from her usual chronic pain pattern.  Pt encouraged to f/u with her PMD, Neuro MD and Pain Management doctor for good continuity of care and control of her chronic pain.  Verb understanding.   1420:  Pt asking for PO food and fluids.  Has not vomited while in the ED.  Improved after meds.  Will d/c.    Laray Anger, DO 05/24/12 1312

## 2012-05-23 ENCOUNTER — Encounter (HOSPITAL_COMMUNITY): Payer: Self-pay | Admitting: *Deleted

## 2012-05-23 ENCOUNTER — Emergency Department (HOSPITAL_COMMUNITY)
Admission: EM | Admit: 2012-05-23 | Discharge: 2012-05-23 | Disposition: A | Discharge status: Home / Self Care | Payer: Self-pay | Attending: Emergency Medicine | Admitting: Emergency Medicine

## 2012-05-23 DIAGNOSIS — F329 Major depressive disorder, single episode, unspecified: Secondary | ICD-10-CM | POA: Insufficient documentation

## 2012-05-23 DIAGNOSIS — Z79899 Other long term (current) drug therapy: Secondary | ICD-10-CM | POA: Insufficient documentation

## 2012-05-23 DIAGNOSIS — F411 Generalized anxiety disorder: Secondary | ICD-10-CM | POA: Insufficient documentation

## 2012-05-23 DIAGNOSIS — G43909 Migraine, unspecified, not intractable, without status migrainosus: Secondary | ICD-10-CM | POA: Insufficient documentation

## 2012-05-23 DIAGNOSIS — R51 Headache: Secondary | ICD-10-CM | POA: Insufficient documentation

## 2012-05-23 DIAGNOSIS — F3289 Other specified depressive episodes: Secondary | ICD-10-CM | POA: Insufficient documentation

## 2012-05-23 MED ORDER — SODIUM CHLORIDE 0.9 % IV BOLUS (SEPSIS)
1000.0000 mL | Freq: Once | INTRAVENOUS | Status: AC
  Administered 2012-05-23: 1000 mL via INTRAVENOUS

## 2012-05-23 MED ORDER — KETOROLAC TROMETHAMINE 30 MG/ML IJ SOLN
30.0000 mg | Freq: Once | INTRAMUSCULAR | Status: AC
  Administered 2012-05-23: 30 mg via INTRAVENOUS

## 2012-05-23 MED ORDER — KETOROLAC TROMETHAMINE 30 MG/ML IJ SOLN
30.0000 mg | Freq: Once | INTRAMUSCULAR | Status: DC
  Filled 2012-05-23: qty 1

## 2012-05-23 MED ORDER — BUTORPHANOL TARTRATE 2 MG/ML IJ SOLN
4.0000 mg | Freq: Once | INTRAMUSCULAR | Status: AC
  Administered 2012-05-23: 4 mg via INTRAVENOUS
  Filled 2012-05-23: qty 2

## 2012-05-23 MED ORDER — DEXAMETHASONE SODIUM PHOSPHATE 10 MG/ML IJ SOLN
10.0000 mg | Freq: Once | INTRAMUSCULAR | Status: AC
  Administered 2012-05-23: 10 mg via INTRAVENOUS
  Filled 2012-05-23: qty 1

## 2012-05-23 MED ORDER — PROMETHAZINE HCL 25 MG/ML IJ SOLN
25.0000 mg | Freq: Once | INTRAMUSCULAR | Status: AC
  Administered 2012-05-23: 25 mg via INTRAVENOUS
  Filled 2012-05-23: qty 1

## 2012-05-23 NOTE — ED Notes (Signed)
Pt ambulated to bathroom 

## 2012-05-23 NOTE — Progress Notes (Signed)
Pt seen by ED CM on 05/22/12 please refer to 05/22/12 note CM provided all available resources for pt

## 2012-05-23 NOTE — ED Notes (Signed)
Pt reports headache that started yesterday morning. Pt states hx of headache. Denies vision changes.

## 2012-05-23 NOTE — ED Notes (Signed)
Pt states she was here yesterday for abdominal pain and migraine, was given IM shot and sent home, pt states the pain has not gone away, pt states having a severe migraine today, pt states started her menstrual cycle and states is very heavy. Pt is having sensitivity to light and sound, states is nauseous but denies vomiting. Pt states she has stomach ulcers and that is why she is having the abdominal pain.

## 2012-05-23 NOTE — ED Provider Notes (Signed)
History     CSN: [id number]  Arrival date & time 05/23/12  1435   First MD Initiated Contact with Patient 05/23/12 2009      Chief Complaint  Patient presents with  . Headache   HPI  History provided by the patient. Patient is a 46 year old female with history of anxiety and depression and recurrent migraine headaches who presents with complaints of continued left-sided migraine headache. Patient states that she does began to heavy menstrual cycle which sometimes triggers her recurrent migraine headaches. Symptoms are similar to multiple previous headaches which she's had for the past 30 years. She denies any aggravating or alleviating factors. She reports some associated nausea without any episodes of vomiting. Denies any fever, neck pain or stiffness. She denies any confusion, speech change or vision change. Denies any weakness or numbness in extremities. It was seen yesterday in the restroom for similar complaints. She states she was given normal medications that she is treated for her migraines but was given these IM and this did not resolve the headache completely and it returned today.    Past Medical History  Diagnosis Date  . Depression   . Migraine   . Anxiety     Past Surgical History  Procedure Date  . Dilation and curettage of uterus   . Esophagogastroduodenoscopy 05/15/2012    Procedure: ESOPHAGOGASTRODUODENOSCOPY (EGD);  Surgeon: Beverley Fiedler, MD;  Location: [facility];  Service: Gastroenterology;  Laterality: N/A;    Family History  Problem Relation Age of Onset  . Hypertension Father   . Cancer Mother     History  Substance Use Topics  . Smoking status: Never Smoker   . Smokeless tobacco: Never Used  . Alcohol Use: Yes     Comment: rarely    OB History    Grav Para Term Preterm Abortions TAB SAB Ect Mult Living                  Review of Systems  Constitutional: Negative for fever, chills and appetite change.  HENT: Negative for neck pain and  neck stiffness.   Eyes: Positive for photophobia.  Gastrointestinal: Positive for nausea. Negative for vomiting, abdominal pain and constipation.  Skin: Negative for rash.  Neurological: Positive for headaches. Negative for dizziness and light-headedness.  Psychiatric/Behavioral: Negative for confusion.    Allergies  Benadryl and Compazine  Home Medications   Current Outpatient Rx  Name  Route  Sig  Dispense  Refill  . BUTORPHANOL TARTRATE 10 MG/ML NA SOLN   Nasal   Place 1 spray into the nose every 4 (four) hours as needed for pain.   2.5 mL   0   . PANTOPRAZOLE SODIUM 40 MG PO TBEC   Oral   Take 40 mg by mouth 2 (two) times daily before a meal.         . SERTRALINE HCL 100 MG PO TABS   Oral   Take 100 mg by mouth every other day.          Marland Kitchen ZOLPIDEM TARTRATE 10 MG PO TABS   Oral   Take 10 mg by mouth at bedtime.            BP 145/93  Pulse 109  Temp 97.9 F (36.6 C) (Oral)  Resp 20  SpO2 100%  LMP 05/22/2012  Physical Exam  Nursing note and vitals reviewed. Constitutional: She is oriented to person, place, and time. She appears well-developed and well-nourished. No distress.  HENT:  Head:  Normocephalic and atraumatic.  Eyes: Conjunctivae normal and EOM are normal. Pupils are equal, round, and reactive to light.  Neck: Normal range of motion. Neck supple.       No meningeal signs  Cardiovascular: Normal rate and regular rhythm.   No murmur heard. Pulmonary/Chest: Effort normal and breath sounds normal. No respiratory distress. She has no wheezes. She has no rales.  Abdominal: Soft. There is no tenderness. There is no rigidity, no rebound, no guarding, no CVA tenderness and no tenderness at McBurney's point.  Neurological: She is alert and oriented to person, place, and time. She has normal strength. No cranial nerve deficit or sensory deficit. Gait normal.  Skin: Skin is warm and dry.  Psychiatric: She has a normal mood and affect. Her behavior is  normal.    ED Course  Procedures    1. Headache       MDM  8:10 PM patient seen and evaluated. Patient does not appear in any discomfort or distress. He has normal nonfocal neuro exam. Symptoms are consistent with history of prior migraines. No new concerning symptoms.          Angus Seller, [place] 05/23/12 2046

## 2012-05-25 NOTE — ED Provider Notes (Signed)
Medical screening examination/treatment/procedure(s) were performed by non-physician practitioner and as supervising physician I was immediately available for consultation/collaboration.   Laray Anger, DO 05/25/12 2003

## 2012-05-27 ENCOUNTER — Encounter (HOSPITAL_COMMUNITY): Payer: Self-pay | Admitting: Emergency Medicine

## 2012-05-27 ENCOUNTER — Emergency Department (HOSPITAL_COMMUNITY)
Admission: EM | Admit: 2012-05-27 | Discharge: 2012-05-27 | Disposition: A | Discharge status: Home / Self Care | Payer: Self-pay | Attending: Emergency Medicine | Admitting: Emergency Medicine

## 2012-05-27 DIAGNOSIS — F329 Major depressive disorder, single episode, unspecified: Secondary | ICD-10-CM | POA: Insufficient documentation

## 2012-05-27 DIAGNOSIS — Z79899 Other long term (current) drug therapy: Secondary | ICD-10-CM | POA: Insufficient documentation

## 2012-05-27 DIAGNOSIS — G43909 Migraine, unspecified, not intractable, without status migrainosus: Secondary | ICD-10-CM | POA: Insufficient documentation

## 2012-05-27 DIAGNOSIS — F411 Generalized anxiety disorder: Secondary | ICD-10-CM | POA: Insufficient documentation

## 2012-05-27 DIAGNOSIS — F3289 Other specified depressive episodes: Secondary | ICD-10-CM | POA: Insufficient documentation

## 2012-05-27 MED ORDER — DEXAMETHASONE SODIUM PHOSPHATE 10 MG/ML IJ SOLN
10.0000 mg | Freq: Once | INTRAMUSCULAR | Status: AC
  Administered 2012-05-27: 10 mg via INTRAMUSCULAR
  Filled 2012-05-27: qty 1

## 2012-05-27 MED ORDER — PROMETHAZINE HCL 25 MG/ML IJ SOLN
25.0000 mg | Freq: Once | INTRAMUSCULAR | Status: AC
  Administered 2012-05-27: 25 mg via INTRAMUSCULAR
  Filled 2012-05-27: qty 1

## 2012-05-27 MED ORDER — BUTORPHANOL TARTRATE 2 MG/ML IJ SOLN
4.0000 mg | Freq: Once | INTRAMUSCULAR | Status: AC
  Administered 2012-05-27: 4 mg via INTRAMUSCULAR
  Filled 2012-05-27: qty 2

## 2012-05-27 MED ORDER — KETOROLAC TROMETHAMINE 30 MG/ML IJ SOLN
60.0000 mg | Freq: Once | INTRAMUSCULAR | Status: AC
  Administered 2012-05-27: 60 mg via INTRAMUSCULAR
  Filled 2012-05-27: qty 2

## 2012-05-27 NOTE — ED Notes (Signed)
Pt states she lost her job in march which then she lost her insurance so her neurologist will not see her or give her prescriptions for her medications, pt states she is out of her Remus Loffler which helped her with the migraines and sleep, also states she cannot afford her migraine medication, pt states she had been here multiple times the past week for migraine and gets a shot or medication through IV, pt states migraine this time started yesterday, pt states she laid in bed all day yesterday and took Goodies but did not help. Pt states sensitivity to light/sound and nauseous, denies vomiting.

## 2012-05-27 NOTE — ED Provider Notes (Signed)
History     CSN: [id number]  Arrival date & time 05/27/12  4098   First MD Initiated Contact with Patient 05/27/12 [phone]      Chief Complaint  Patient presents with  . Migraine    (Consider location/radiation/quality/duration/timing/severity/associated sxs/prior treatment) Patient is a 46 y.o. female presenting with migraines. The history is provided by the patient.  Migraine   patient here complaining of migraine which is similar to her prior headaches. Denies this being was headache of her life. No fever or neck pain. No upper or lower extremity weakness or paresthesias. Pain is worse with sound and light. No syncope. No abdominal or chest pain. Hasn't seen multiple times in the past for similar symptoms. Doesn't have any health insurance and therefore has not followed up  Past Medical History  Diagnosis Date  . Depression   . Migraine   . Anxiety     Past Surgical History  Procedure Date  . Dilation and curettage of uterus   . Esophagogastroduodenoscopy 05/15/2012    Procedure: ESOPHAGOGASTRODUODENOSCOPY (EGD);  Surgeon: Beverley Fiedler, MD;  Location: [facility];  Service: Gastroenterology;  Laterality: N/A;    Family History  Problem Relation Age of Onset  . Hypertension Father   . Cancer Mother     History  Substance Use Topics  . Smoking status: Never Smoker   . Smokeless tobacco: Never Used  . Alcohol Use: Yes     Comment: rarely    OB History    Grav Para Term Preterm Abortions TAB SAB Ect Mult Living                  Review of Systems  All other systems reviewed and are negative.    Allergies  Benadryl and Compazine  Home Medications   Current Outpatient Rx  Name  Route  Sig  Dispense  Refill  . BUTORPHANOL TARTRATE 10 MG/ML NA SOLN   Nasal   Place 1 spray into the nose every 4 (four) hours as needed for pain.   2.5 mL   0   . PANTOPRAZOLE SODIUM 40 MG PO TBEC   Oral   Take 40 mg by mouth 2 (two) times daily before a meal.           . SERTRALINE HCL 100 MG PO TABS   Oral   Take 100 mg by mouth every other day.          Marland Kitchen ZOLPIDEM TARTRATE 10 MG PO TABS   Oral   Take 10 mg by mouth at bedtime.            BP 127/77  Pulse 99  Temp 98.7 F (37.1 C) (Oral)  Resp 20  SpO2 100%  LMP 05/22/2012  Physical Exam  Nursing note and vitals reviewed. Constitutional: She is oriented to person, place, and time. She appears well-developed and well-nourished.  Non-toxic appearance. No distress.  HENT:  Head: Normocephalic and atraumatic.  Eyes: Conjunctivae normal, EOM and lids are normal. Pupils are equal, round, and reactive to light.  Neck: Normal range of motion. Neck supple. No tracheal deviation present. No mass present.  Cardiovascular: Normal rate, regular rhythm and normal heart sounds.  Exam reveals no gallop.   No murmur heard. Pulmonary/Chest: Effort normal and breath sounds normal. No stridor. No respiratory distress. She has no decreased breath sounds. She has no wheezes. She has no rhonchi. She has no rales.  Abdominal: Soft. Normal appearance and bowel sounds are normal. She exhibits  no distension. There is no tenderness. There is no rebound and no CVA tenderness.  Musculoskeletal: Normal range of motion. She exhibits no edema and no tenderness.  Neurological: She is alert and oriented to person, place, and time. She has normal strength. No cranial nerve deficit or sensory deficit. GCS eye subscore is 4. GCS verbal subscore is 5. GCS motor subscore is 6.  Skin: Skin is warm and dry. No abrasion and no rash noted.  Psychiatric: She has a normal mood and affect. Her speech is normal and behavior is normal.    ED Course  Procedures (including critical care time)  Labs Reviewed - No data to display No results found.   No diagnosis found.    MDM  Patient given her usual migraine cocktail. She has been seen by case management person in the past has been given community resources. She was encouraged  to followup with this        Toy Baker, MD 05/27/12 1022

## 2012-05-27 NOTE — ED Notes (Addendum)
Pt presenting to ed with c/o migraine headache since yesterday. Pt states "my neurologist want see me anymore pt states I called to reschedule and they told me they wouldn't see me any more". Pt states so " I have been coming here for shots" pt states I was on ambien also for migraines and I have trouble sleeping. Pt states "I usually get stadol, decadron, phenergan and tordol and it usually works better IV"

## 2012-06-01 ENCOUNTER — Encounter (HOSPITAL_COMMUNITY): Payer: Self-pay | Admitting: *Deleted

## 2012-06-01 ENCOUNTER — Emergency Department (HOSPITAL_COMMUNITY)
Admission: EM | Admit: 2012-06-01 | Discharge: 2012-06-01 | Disposition: A | Discharge status: Home / Self Care | Payer: Self-pay | Attending: Emergency Medicine | Admitting: Emergency Medicine

## 2012-06-01 DIAGNOSIS — Z8659 Personal history of other mental and behavioral disorders: Secondary | ICD-10-CM | POA: Insufficient documentation

## 2012-06-01 DIAGNOSIS — R112 Nausea with vomiting, unspecified: Secondary | ICD-10-CM | POA: Insufficient documentation

## 2012-06-01 DIAGNOSIS — F3289 Other specified depressive episodes: Secondary | ICD-10-CM | POA: Insufficient documentation

## 2012-06-01 DIAGNOSIS — Z765 Malingerer [conscious simulation]: Secondary | ICD-10-CM | POA: Insufficient documentation

## 2012-06-01 DIAGNOSIS — F329 Major depressive disorder, single episode, unspecified: Secondary | ICD-10-CM | POA: Insufficient documentation

## 2012-06-01 DIAGNOSIS — Z79899 Other long term (current) drug therapy: Secondary | ICD-10-CM | POA: Insufficient documentation

## 2012-06-01 DIAGNOSIS — G43909 Migraine, unspecified, not intractable, without status migrainosus: Secondary | ICD-10-CM | POA: Insufficient documentation

## 2012-06-01 DIAGNOSIS — R51 Headache: Secondary | ICD-10-CM | POA: Insufficient documentation

## 2012-06-01 DIAGNOSIS — H53149 Visual discomfort, unspecified: Secondary | ICD-10-CM | POA: Insufficient documentation

## 2012-06-01 MED ORDER — METOCLOPRAMIDE HCL 5 MG/ML IJ SOLN
10.0000 mg | Freq: Once | INTRAMUSCULAR | Status: DC
  Filled 2012-06-01: qty 2

## 2012-06-01 MED ORDER — METOCLOPRAMIDE HCL 5 MG/ML IJ SOLN
10.0000 mg | Freq: Once | INTRAMUSCULAR | Status: AC
  Administered 2012-06-01: 10 mg via INTRAMUSCULAR

## 2012-06-01 MED ORDER — LORAZEPAM 1 MG PO TABS
1.0000 mg | ORAL_TABLET | Freq: Once | ORAL | Status: AC
  Administered 2012-06-01: 1 mg via ORAL
  Filled 2012-06-01: qty 1

## 2012-06-01 MED ORDER — DEXAMETHASONE SODIUM PHOSPHATE 10 MG/ML IJ SOLN
10.0000 mg | Freq: Once | INTRAMUSCULAR | Status: AC
  Administered 2012-06-01: 10 mg via INTRAMUSCULAR
  Filled 2012-06-01: qty 1

## 2012-06-01 MED ORDER — VALPROATE SODIUM 500 MG/5ML IV SOLN: 500.0000 mg | Freq: Once | INTRAVENOUS | Status: DC

## 2012-06-01 MED ORDER — KETOROLAC TROMETHAMINE 30 MG/ML IJ SOLN: 30.0000 mg | Freq: Once | INTRAMUSCULAR | Status: DC

## 2012-06-01 MED ORDER — PROMETHAZINE HCL 25 MG/ML IJ SOLN
25.0000 mg | INTRAMUSCULAR | Status: DC
  Filled 2012-06-01: qty 1

## 2012-06-01 MED ORDER — DIPHENHYDRAMINE HCL 50 MG/ML IJ SOLN
50.0000 mg | Freq: Once | INTRAMUSCULAR | Status: DC
  Filled 2012-06-01: qty 1

## 2012-06-01 MED ORDER — VALPROATE SODIUM 500 MG/5ML IV SOLN
500.0000 mg | INTRAVENOUS | Status: DC
  Filled 2012-06-01: qty 5

## 2012-06-01 MED ORDER — SODIUM CHLORIDE 0.9 % IV BOLUS (SEPSIS): 1000.0000 mL | Freq: Once | INTRAVENOUS | Status: DC

## 2012-06-01 NOTE — ED Notes (Signed)
JYN:[id number]<AO> Expected date:<BR> Expected time:<BR> Means of arrival:<BR> Comments:<BR> For triage 1

## 2012-06-01 NOTE — ED Notes (Signed)
Pt with hx of migraines states that she began having a migraine last night. It is typical for her usual migraine. Pt reports nausea but denies vomiting. Pt also endorses sensitivity to light, sound and smell. Pt denies any changes in vision or gait.

## 2012-06-01 NOTE — ED Provider Notes (Signed)
History     CSN: [id number]  Arrival date & time 06/01/12  0865   First MD Initiated Contact with Patient 06/01/12 1010      Chief Complaint  Patient presents with  . Migraine    (Consider location/radiation/quality/duration/timing/severity/associated sxs/prior treatment) HPI Comments: 46 year old female with a history of stomach ulcers, depression, migraines and anxiety presents emergency department with chief complaint of migraine.  She reports that she's been having migraines the last 30 years and this is like her typical migraine.  Associated symptoms include nausea, photophobia and sensitivity to sound.  Patient denies any emesis, change in vision, head trauma, dysequilibrium/ataxia, CP, SOB, estrogen use, leg pain, F, NS, chills or nuchal rigidity. Pt requests Stadol & Phenagren to treat her migraine stating "only IV medications work." Chart reviewed, 24 visits in last 6 months with majority receiving narcotics to treat migraine. Endoscopy by Dr. Radene Journey confirms ulcers w strict instructions to avoid NSAIDs as treatment for this patient.    Past Medical History  Diagnosis Date  . Depression   . Migraine   . Anxiety     Past Surgical History  Procedure Date  . Dilation and curettage of uterus   . Esophagogastroduodenoscopy 05/15/2012    Procedure: ESOPHAGOGASTRODUODENOSCOPY (EGD);  Surgeon: Beverley Fiedler, MD;  Location: [facility];  Service: Gastroenterology;  Laterality: N/A;    Family History  Problem Relation Age of Onset  . Hypertension Father   . Cancer Mother     History  Substance Use Topics  . Smoking status: Never Smoker   . Smokeless tobacco: Never Used  . Alcohol Use: Yes     Comment: rarely    OB History    Grav Para Term Preterm Abortions TAB SAB Ect Mult Living   0               Review of Systems  Constitutional: Positive for activity change. Negative for fever, chills, diaphoresis and fatigue.  HENT: Negative for ear pain, congestion, facial  swelling, neck pain, neck stiffness, sinus pressure and tinnitus.   Eyes: Positive for photophobia. Negative for redness and visual disturbance.  Respiratory: Negative for cough, shortness of breath, wheezing and stridor.   Cardiovascular: Negative for chest pain.  Gastrointestinal: Positive for nausea and vomiting. Negative for abdominal pain.  Musculoskeletal: Negative for myalgias and gait problem.  Skin: Negative for rash.  Neurological: Positive for headaches. Negative for dizziness, syncope, speech difficulty, weakness, light-headedness and numbness.       No bowel or bladder incontinence.  Psychiatric/Behavioral: Negative for confusion.  All other systems reviewed and are negative.    Allergies  Benadryl and Compazine  Home Medications   Current Outpatient Rx  Name  Route  Sig  Dispense  Refill  . BUTORPHANOL TARTRATE 10 MG/ML NA SOLN   Nasal   Place 1 spray into the nose every 4 (four) hours as needed for pain.   2.5 mL   0   . PANTOPRAZOLE SODIUM 40 MG PO TBEC   Oral   Take 40 mg by mouth 2 (two) times daily before a meal.         . SERTRALINE HCL 100 MG PO TABS   Oral   Take 100 mg by mouth every other day.          Marland Kitchen ZOLPIDEM TARTRATE 10 MG PO TABS   Oral   Take 10 mg by mouth at bedtime.            BP 142/98  Pulse 130  Temp 99 F (37.2 C)  Resp 16  SpO2 100%  LMP 05/22/2012  Physical Exam  Nursing note and vitals reviewed. Constitutional: She is oriented to person, place, and time. She appears well-developed and well-nourished. She appears distressed.  HENT:  Head: Normocephalic and atraumatic.  Eyes: Conjunctivae normal and EOM are normal. Pupils are equal, round, and reactive to light. No scleral icterus.  Neck: Normal range of motion.       Neck supple with no nuchal rigidity, full pain free ROM. No carotid bruit  Cardiovascular: Regular rhythm, normal heart sounds and intact distal pulses.        Tachycardic    Pulmonary/Chest: Effort  normal and breath sounds normal. No respiratory distress.  Musculoskeletal: Normal range of motion.  Neurological: She is alert and oriented to person, place, and time. She has normal strength.       CN III-XII intact, good coordination, normal gait, strength 5/5 bilaterally, intact distal sensation.  Skin: Skin is warm and dry.       No rash, non diaphoretic    ED Course  Procedures (including critical care time)  Labs Reviewed - No data to display No results found.   No diagnosis found.  BP 129/87  Pulse 103  Temp 98.4 F (36.9 C) (Oral)  Resp 20  SpO2 95%  LMP 05/22/2012   MDM  Migraine , ? Drug seeking behavior  46yo F w hx of stomach ulcers, anxiety & migraines presents to ER w c/o Migraine. 24 visits in last 6 months. Ambulates throughout ED without difficulty.  Pt HA treated with Migraine cocktail while in ED, (no NSAIDs), NO NARCOTICS given.  Presentation is like pts typical HA and non concerning for [facility], ICH, Meningitis, or temporal arteritis. Pt is afebrile with no focal neuro deficits, nuchal rigidity, or change in vision. Pt is to follow up with PCP/chronic pain mngt. In depth discussion that narcotics are not typically used to treat migraine. Pt verbalizes understanding and is agreeable with plan to dc.          Jaci Carrel, [place] 06/01/12 1206

## 2012-06-01 NOTE — ED Provider Notes (Signed)
Medical screening examination/treatment/procedure(s) were performed by non-physician practitioner and as supervising physician I was immediately available for consultation/collaboration.  Pt discussed with me.   Devoria Albe, MD, Armando Gang   Ward Givens, MD 06/01/12 [phone]

## 2012-06-13 ENCOUNTER — Emergency Department (HOSPITAL_COMMUNITY)
Admission: EM | Admit: 2012-06-13 | Discharge: 2012-06-13 | Disposition: A | Discharge status: Home / Self Care | Payer: Self-pay | Attending: Emergency Medicine | Admitting: Emergency Medicine

## 2012-06-13 ENCOUNTER — Encounter (HOSPITAL_COMMUNITY): Payer: Self-pay

## 2012-06-13 DIAGNOSIS — H53149 Visual discomfort, unspecified: Secondary | ICD-10-CM | POA: Insufficient documentation

## 2012-06-13 DIAGNOSIS — R110 Nausea: Secondary | ICD-10-CM | POA: Insufficient documentation

## 2012-06-13 DIAGNOSIS — G43909 Migraine, unspecified, not intractable, without status migrainosus: Secondary | ICD-10-CM | POA: Insufficient documentation

## 2012-06-13 DIAGNOSIS — Z79899 Other long term (current) drug therapy: Secondary | ICD-10-CM | POA: Insufficient documentation

## 2012-06-13 DIAGNOSIS — Z8659 Personal history of other mental and behavioral disorders: Secondary | ICD-10-CM | POA: Insufficient documentation

## 2012-06-13 MED ORDER — BUTALBITAL-APAP-CAFFEINE 50-325-40 MG PO TABS: 1.0000 | ORAL_TABLET | Freq: Four times a day (QID) | ORAL | Status: DC | PRN

## 2012-06-13 MED ORDER — KETOROLAC TROMETHAMINE 30 MG/ML IJ SOLN
60.0000 mg | Freq: Once | INTRAMUSCULAR | Status: AC
  Administered 2012-06-13: 60 mg via INTRAMUSCULAR
  Filled 2012-06-13: qty 2

## 2012-06-13 MED ORDER — PROMETHAZINE HCL 25 MG/ML IJ SOLN
25.0000 mg | Freq: Four times a day (QID) | INTRAMUSCULAR | Status: DC | PRN
  Administered 2012-06-13: 25 mg via INTRAMUSCULAR
  Filled 2012-06-13: qty 1

## 2012-06-13 MED ORDER — HYDROMORPHONE HCL PF 1 MG/ML IJ SOLN
1.0000 mg | Freq: Once | INTRAMUSCULAR | Status: AC
  Administered 2012-06-13: 1 mg via INTRAMUSCULAR
  Filled 2012-06-13: qty 1

## 2012-06-13 NOTE — ED Provider Notes (Signed)
History     CSN: [id number]  Arrival date & time 06/13/12  1336   First MD Initiated Contact with Patient 06/13/12 1350      Chief Complaint  Patient presents with  . Migraine    (Consider location/radiation/quality/duration/timing/severity/associated sxs/prior treatment) HPI The patient presents with concerns of a headache.  She has a history of migraines.  She states this headache is the same as in numerable prior headaches, diffuse, throbbing, with associated photophobia.  She notes no relief with OTC medication. The patient only his prescription medication, as she no longer sees her neurologist.  She denies any new disorientation, confusion, ataxia, vomiting, diarrhea, visual changes or any other new characteristics of this headache.  This has been present for one day, with subacute onset.     Past Medical History  Diagnosis Date  . Depression   . Migraine   . Anxiety     Past Surgical History  Procedure Date  . Dilation and curettage of uterus   . Esophagogastroduodenoscopy 05/15/2012    Procedure: ESOPHAGOGASTRODUODENOSCOPY (EGD);  Surgeon: Beverley Fiedler, MD;  Location: [facility];  Service: Gastroenterology;  Laterality: N/A;    Family History  Problem Relation Age of Onset  . Hypertension Father   . Cancer Mother     History  Substance Use Topics  . Smoking status: Never Smoker   . Smokeless tobacco: Never Used  . Alcohol Use: Yes     Comment: rarely    OB History    Grav Para Term Preterm Abortions TAB SAB Ect Mult Living   0               Review of Systems  Constitutional: Positive for activity change. Negative for fever, chills, diaphoresis and fatigue.  HENT: Negative for ear pain, congestion, facial swelling, neck pain, neck stiffness, sinus pressure and tinnitus.   Eyes: Positive for photophobia. Negative for redness and visual disturbance.  Respiratory: Negative for cough, shortness of breath, wheezing and stridor.   Cardiovascular: Negative  for chest pain.  Gastrointestinal: Positive for nausea. Negative for vomiting and abdominal pain.  Musculoskeletal: Negative for myalgias and gait problem.  Skin: Negative for rash.  Neurological: Positive for headaches. Negative for dizziness, syncope, speech difficulty, weakness, light-headedness and numbness.       No bowel or bladder incontinence.  Psychiatric/Behavioral: Negative for confusion.  All other systems reviewed and are negative.    Allergies  Benadryl and Compazine  Home Medications   Current Outpatient Rx  Name  Route  Sig  Dispense  Refill  . BUTORPHANOL TARTRATE 10 MG/ML NA SOLN   Nasal   Place 1 spray into the nose every 4 (four) hours as needed for pain.   2.5 mL   0   . PANTOPRAZOLE SODIUM 40 MG PO TBEC   Oral   Take 40 mg by mouth 2 (two) times daily before a meal.         . SERTRALINE HCL 100 MG PO TABS   Oral   Take 100 mg by mouth every other day.          Marland Kitchen ZOLPIDEM TARTRATE 10 MG PO TABS   Oral   Take 10 mg by mouth at bedtime.            BP 156/87  Pulse 139  Temp 97.9 F (36.6 C) (Oral)  Resp 20  SpO2 100%  LMP 05/22/2012  Physical Exam  Nursing note and vitals reviewed. Constitutional: She is oriented to  person, place, and time. She appears well-developed and well-nourished. No distress.  HENT:  Head: Normocephalic and atraumatic.  Eyes: Conjunctivae normal and EOM are normal.  Cardiovascular: Regular rhythm.  Tachycardia present.        Heart rate 100, different from initial note  Pulmonary/Chest: Effort normal and breath sounds normal. No stridor. No respiratory distress.  Abdominal: She exhibits no distension.  Musculoskeletal: She exhibits no edema.  Neurological: She is alert and oriented to person, place, and time. No cranial nerve deficit.  Skin: Skin is warm and dry.  Psychiatric: She has a normal mood and affect.    ED Course  Procedures (including critical care time)  Labs Reviewed - No data to display No  results found.   No diagnosis found.    MDM  This patient presents with concerns of headache.  Notably, the patient has a long history of migraines, and has been evaluated here and 20 times in the past 6 months for this complaint.  The patient's description of pain it is the same as in numerable prior events his reassuring.  Similar reassurance taken from the absence of neurologic complaints.  Patient is mildly tachycardic, about 100, but in no distress.  The patient received IM medications, was discharged in stable condition.   Gerhard Munch, MD 06/13/12 [phone]

## 2012-06-13 NOTE — ED Notes (Addendum)
Patient reports that she has a history of migraines. Patient states the migraine started last night and she has taken Goody's powder with no relief. Patient  Positive for photophobia and sound makes the headache worse. Patient denies blurred vision.

## 2012-06-20 ENCOUNTER — Telehealth: Payer: Self-pay | Admitting: Internal Medicine

## 2012-06-21 NOTE — Telephone Encounter (Signed)
Pt had EGD at [facility] on 05/15/12 with dilation. She was to call if she felt she developed swallowing problems again. She reports for the last week as she has advanced her diet, she has painful swallowing with pasta, bread, and hamburgers. She states as of now, she hasn't thrown the food back up. Pt reports she is trying to find a job. She has applied for [facility] assistance, but admits she did not f/u until yesterday and left a message for someone to call her back. Explained to pt I will ask Dr Rhea Belton on 06/24/12 and ask where he would like for me to schedule the pt for possible dilation. Please advise on whether pt needs to be seen or Direct EGD can be ordered and where. Thanks.

## 2012-06-23 ENCOUNTER — Emergency Department (HOSPITAL_COMMUNITY)
Admission: EM | Admit: 2012-06-23 | Discharge: 2012-06-23 | Disposition: A | Discharge status: Home / Self Care | Payer: Self-pay | Attending: Emergency Medicine | Admitting: Emergency Medicine

## 2012-06-23 ENCOUNTER — Encounter (HOSPITAL_COMMUNITY): Payer: Self-pay

## 2012-06-23 DIAGNOSIS — G43909 Migraine, unspecified, not intractable, without status migrainosus: Secondary | ICD-10-CM | POA: Insufficient documentation

## 2012-06-23 DIAGNOSIS — Z7982 Long term (current) use of aspirin: Secondary | ICD-10-CM | POA: Insufficient documentation

## 2012-06-23 DIAGNOSIS — H53149 Visual discomfort, unspecified: Secondary | ICD-10-CM | POA: Insufficient documentation

## 2012-06-23 MED ORDER — PROMETHAZINE HCL 25 MG/ML IJ SOLN
25.0000 mg | Freq: Once | INTRAMUSCULAR | Status: AC
  Administered 2012-06-23: 25 mg via INTRAMUSCULAR
  Filled 2012-06-23: qty 1

## 2012-06-23 MED ORDER — KETOROLAC TROMETHAMINE 60 MG/2ML IM SOLN
60.0000 mg | Freq: Once | INTRAMUSCULAR | Status: AC
  Administered 2012-06-23: 60 mg via INTRAMUSCULAR
  Filled 2012-06-23: qty 2

## 2012-06-23 MED ORDER — HYDROMORPHONE HCL PF 1 MG/ML IJ SOLN
1.0000 mg | Freq: Once | INTRAMUSCULAR | Status: AC
  Administered 2012-06-23: 1 mg via INTRAMUSCULAR
  Filled 2012-06-23: qty 1

## 2012-06-23 NOTE — ED Notes (Signed)
Patient reports that she began having a migraine yesterday, but at 0430 was hurting really bad. Patient reports that she took a Goody's headache powder and then a Fioricet which did not work.

## 2012-06-23 NOTE — ED Provider Notes (Signed)
Medical screening examination/treatment/procedure(s) were performed by non-physician practitioner and as supervising physician I was immediately available for consultation/collaboration.   Richardean Canal, MD 06/23/12 [phone]

## 2012-06-23 NOTE — ED Provider Notes (Signed)
History     CSN: [id number]  Arrival date & time 06/23/12  1423   First MD Initiated Contact with Patient 06/23/12 1637      Chief Complaint  Patient presents with  . Migraine    (Consider location/radiation/quality/duration/timing/severity/associated sxs/prior treatment) HPI Maureen Chen is a 46 y.o. female who presents complaining of a migraine. Pt states she has hx of migraine. States got released by her neurologist because lost her insurance a year ago. Has been to ER several times each month with same complaint. States this headache is typical of her normal migraines. Started at 4am.headache is sharp, gradual in onset, over bilateral temples. sensitive to light, nausea. No fever, neck stiffness, visual changes. No other complaints.    Past Medical History  Diagnosis Date  . Depression   . Migraine   . Anxiety     Past Surgical History  Procedure Date  . Dilation and curettage of uterus   . Esophagogastroduodenoscopy 05/15/2012    Procedure: ESOPHAGOGASTRODUODENOSCOPY (EGD);  Surgeon: Beverley Fiedler, MD;  Location: [facility];  Service: Gastroenterology;  Laterality: N/A;    Family History  Problem Relation Age of Onset  . Hypertension Father   . Cancer Mother     History  Substance Use Topics  . Smoking status: Never Smoker   . Smokeless tobacco: Never Used  . Alcohol Use: Yes     Comment: rarely    OB History    Grav Para Term Preterm Abortions TAB SAB Ect Mult Living   0               Review of Systems  Constitutional: Negative for fever and chills.  HENT: Negative for neck pain and neck stiffness.   Eyes: Positive for photophobia. Negative for pain, redness and visual disturbance.  Respiratory: Negative.   Cardiovascular: Negative.   Musculoskeletal: Negative for myalgias and arthralgias.  Skin: Negative.   Neurological: Positive for headaches. Negative for dizziness and light-headedness.    Allergies  Benadryl and Compazine  Home  Medications   Current Outpatient Rx  Name  Route  Sig  Dispense  Refill  . GOODY HEADACHE PO   Oral   Take by mouth.         Marland Kitchen BUTALBITAL-APAP-CAFFEINE 50-325-40 MG PO TABS   Oral   Take 2 tablets by mouth every 6 (six) hours as needed.         Marland Kitchen PANTOPRAZOLE SODIUM 40 MG PO TBEC   Oral   Take 40 mg by mouth daily.           BP 99/77  Pulse 105  Temp 97.1 F (36.2 C)  Resp 16  SpO2 100%  LMP 06/23/2012  Physical Exam  Nursing note and vitals reviewed. Constitutional: She is oriented to person, place, and time. She appears well-developed and well-nourished. No distress.  HENT:  Head: Normocephalic and atraumatic.  Left Ear: External ear normal.  Nose: Nose normal.  Mouth/Throat: Oropharynx is clear and moist.       No tenderness over temporal arteries  Eyes: Conjunctivae normal and EOM are normal. Pupils are equal, round, and reactive to light.  Neck: Normal range of motion. Neck supple.  Cardiovascular: Regular rhythm and normal heart sounds.        tachycardic  Pulmonary/Chest: Effort normal and breath sounds normal. No respiratory distress. She has no wheezes. She has no rales.  Musculoskeletal: She exhibits no edema.  Neurological: She is alert and oriented to person, place, and  time.       5/5 and equal upper and lower extremity strength bilaterally. Equal grip strength bilaterally. Normal finger to nose and heel to shin. No pronator drift. Patellar reflexes 2+   Skin: Skin is warm and dry.  Psychiatric: She has a normal mood and affect.    ED Course  Procedures (including critical care time)    1. Migraine       MDM  Pt here with typical for her migraine headache. No improvement with Fioricet and goody powder. No unusual symptoms. Neuro exam normal. No meningismus. No visual changes. Pt normally gets her usual medications. Will give IM meds, phenergan, toradol, dilaudid per her request. Pt encouraged to follow up with pcp. Pt is tachycardic, which  is a recurrent problem for her.   Filed Vitals:   06/23/12 1437  BP: 99/77  Pulse: 105  Temp: 97.1 F (36.2 C)  Resp: [address] A Ellard Nan, [place] 06/23/12 2334

## 2012-06-24 NOTE — Telephone Encounter (Signed)
I'm okay for direct EGD for dilation and known Schatzki's ring However, she needs to remain off aspirin for at least 7 days prior to this procedure, but she should already be avoiding NSAIDs strictly given her duodenal ulcer disease Is her abdominal pain better? Does she remain on twice a day PPI ?

## 2012-06-25 NOTE — Telephone Encounter (Signed)
Left message for pt to call back  °

## 2012-06-25 NOTE — Telephone Encounter (Signed)
Informed pt of Dr Lauro Franklin advice; she will call when dilation is needed.

## 2012-06-28 ENCOUNTER — Ambulatory Visit (AMBULATORY_SURGERY_CENTER): Payer: Self-pay | Admitting: *Deleted

## 2012-06-28 VITALS — Ht 67.0 in | Wt 226.8 lb

## 2012-06-28 DIAGNOSIS — R1310 Dysphagia, unspecified: Secondary | ICD-10-CM

## 2012-06-30 ENCOUNTER — Emergency Department (HOSPITAL_COMMUNITY)
Admission: EM | Admit: 2012-06-30 | Discharge: 2012-06-30 | Disposition: A | Discharge status: Home / Self Care | Payer: Self-pay | Attending: Emergency Medicine | Admitting: Emergency Medicine

## 2012-06-30 ENCOUNTER — Encounter (HOSPITAL_COMMUNITY): Payer: Self-pay | Admitting: *Deleted

## 2012-06-30 DIAGNOSIS — Z79899 Other long term (current) drug therapy: Secondary | ICD-10-CM | POA: Insufficient documentation

## 2012-06-30 DIAGNOSIS — Z8659 Personal history of other mental and behavioral disorders: Secondary | ICD-10-CM | POA: Insufficient documentation

## 2012-06-30 DIAGNOSIS — G8929 Other chronic pain: Secondary | ICD-10-CM | POA: Insufficient documentation

## 2012-06-30 DIAGNOSIS — H53149 Visual discomfort, unspecified: Secondary | ICD-10-CM | POA: Insufficient documentation

## 2012-06-30 DIAGNOSIS — G43909 Migraine, unspecified, not intractable, without status migrainosus: Secondary | ICD-10-CM | POA: Insufficient documentation

## 2012-06-30 MED ORDER — PROCHLORPERAZINE MALEATE 10 MG PO TABS
10.0000 mg | ORAL_TABLET | Freq: Once | ORAL | Status: DC
  Filled 2012-06-30: qty 1

## 2012-06-30 MED ORDER — SUMATRIPTAN SUCCINATE 6 MG/0.5ML ~~LOC~~ SOLN
6.0000 mg | Freq: Once | SUBCUTANEOUS | Status: AC
  Administered 2012-06-30: 6 mg via SUBCUTANEOUS
  Filled 2012-06-30: qty 0.5

## 2012-06-30 MED ORDER — DIVALPROEX SODIUM 500 MG PO DR TAB
500.0000 mg | DELAYED_RELEASE_TABLET | Freq: Two times a day (BID) | ORAL | Status: DC
  Administered 2012-06-30: 500 mg via ORAL
  Filled 2012-06-30: qty 1

## 2012-06-30 MED ORDER — DIPHENHYDRAMINE HCL 25 MG PO CAPS
25.0000 mg | ORAL_CAPSULE | Freq: Once | ORAL | Status: DC
  Filled 2012-06-30: qty 1

## 2012-06-30 NOTE — ED Notes (Signed)
Pt states woke up this morning around 4 am w/ a migraine, nauseous, denies vomiting.

## 2012-06-30 NOTE — ED Notes (Signed)
Pt escorted to discharge window. Pt verbalized understanding discharge instructions. In no acute distress.  

## 2012-06-30 NOTE — ED Provider Notes (Signed)
History     CSN: [id number]  Arrival date & time 06/30/12  0932   First MD Initiated Contact with Patient 06/30/12 1001      No chief complaint on file.   (Consider location/radiation/quality/duration/timing/severity/associated sxs/prior treatment) HPI Comments: Maureen Chen is a 46 y.o. female with a history of anxiety, depression, and chronic migraines.  Patient presents today complaining of migraine.  Presentation is similar to previous migraines with onset at 4 a.m. Symptoms are moderate and unrelieved by home treatments. Patient denies head trauma, emesis, nausea, double vision, ataxia, or disequilibrium.  Patient reports that primary care orshe just got a new job and is unable to pay for primary care. She presents requesting her typical migraine cocktail of Stadol & Dilaudid.   The history is provided by the patient.    Past Medical History  Diagnosis Date  . Depression   . Migraine   . Anxiety     Past Surgical History  Procedure Date  . Dilation and curettage of uterus   . Esophagogastroduodenoscopy 05/15/2012    Procedure: ESOPHAGOGASTRODUODENOSCOPY (EGD);  Surgeon: Beverley Fiedler, MD;  Location: [facility];  Service: Gastroenterology;  Laterality: N/A;    Family History  Problem Relation Age of Onset  . Hypertension Father   . Cancer Mother   . Colon cancer Neg Hx   . Stomach cancer Neg Hx     History  Substance Use Topics  . Smoking status: Never Smoker   . Smokeless tobacco: Never Used  . Alcohol Use: Yes     Comment: rarely    OB History    Grav Para Term Preterm Abortions TAB SAB Ect Mult Living   0               Review of Systems  Constitutional: Negative for fever, chills, diaphoresis, activity change and fatigue.  HENT: Negative for ear pain, congestion, facial swelling, neck pain, neck stiffness, sinus pressure and tinnitus.   Eyes: Positive for photophobia. Negative for redness and visual disturbance.  Respiratory: Negative for  cough, shortness of breath, wheezing and stridor.   Cardiovascular: Negative for chest pain.  Gastrointestinal: Negative for nausea, vomiting and abdominal pain.  Genitourinary: Negative for dysuria.  Musculoskeletal: Negative for myalgias and gait problem.  Skin: Negative for color change, rash and wound.  Neurological: Positive for headaches. Negative for dizziness, syncope, speech difficulty, weakness, light-headedness and numbness.       No bowel or bladder incontinence.  Psychiatric/Behavioral: Negative for confusion.  All other systems reviewed and are negative.    Allergies  Benadryl and Compazine  Home Medications   Current Outpatient Rx  Name  Route  Sig  Dispense  Refill  . GOODY HEADACHE PO   Oral   Take by mouth.         Marland Kitchen BUTALBITAL-APAP-CAFFEINE 50-325-40 MG PO TABS   Oral   Take 2 tablets by mouth every 6 (six) hours as needed.         Marland Kitchen PANTOPRAZOLE SODIUM 40 MG PO TBEC   Oral   Take 40 mg by mouth every evening.            BP 141/97  Pulse 112  Temp 98.5 F (36.9 C) (Oral)  Resp 18  SpO2 98%  LMP 06/23/2012  Physical Exam  Nursing note and vitals reviewed. Constitutional: She is oriented to person, place, and time. She appears well-developed and well-nourished. She appears distressed.  HENT:  Head: Normocephalic and atraumatic.  Eyes: Conjunctivae  normal and EOM are normal. Pupils are equal, round, and reactive to light. No scleral icterus.  Neck: Normal range of motion.       Neck supple with no nuchal rigidity, full pain free ROM. No carotid bruit  Cardiovascular: Normal rate, regular rhythm, normal heart sounds and intact distal pulses.   Pulmonary/Chest: Effort normal and breath sounds normal. No respiratory distress.  Musculoskeletal: Normal range of motion.  Neurological: She is alert and oriented to person, place, and time. She has normal strength.       CN III-XII intact, good coordination, normal gait, strength 5/5 bilaterally,  intact distal sensation.  Skin: Skin is warm and dry.       No rash, non diaphoretic    ED Course  Procedures (including critical care time)  Labs Reviewed - No data to display No results found.   No diagnosis found.  Chronic tachycardia seen on past visits as well, Pt denies any CP  BP 141/97  Pulse 112  Temp 98.5 F (36.9 C) (Oral)  Resp 18  SpO2 98%  LMP 06/23/2012   MDM  Chronic Pain, Migraine  Pt to ER for typical migraine. She has been to ER 26 times in the last 6 months including being seen by me. I explained again as I have previously that Narcotics are typically used to treat migraines. Pt has been advised to follow up with her PCP as we do not treat chronic pain in there ER. Pt ambulates without difficulty. Refused compazine stating it causes anxiety and does not want to try Reglan bc "it does not work on my migraines." Imitrex injection given with some relief. Pt has also been to [facility] multiple times for similar presentation. Note if pt returns to the ER do NOT TREAT WITH TORADOL, hx of bleeding stomach ulcers dx via EGD (Dr. Rhea Belton)         Jaci Carrel, PA-C 06/30/12 [address], PA-C 07/04/12 1525

## 2012-07-01 ENCOUNTER — Emergency Department (HOSPITAL_COMMUNITY)
Admission: EM | Admit: 2012-07-01 | Discharge: 2012-07-01 | Disposition: A | Discharge status: Home / Self Care | Payer: Self-pay | Attending: Emergency Medicine | Admitting: Emergency Medicine

## 2012-07-01 ENCOUNTER — Encounter (HOSPITAL_COMMUNITY): Payer: Self-pay | Admitting: Emergency Medicine

## 2012-07-01 DIAGNOSIS — Z79899 Other long term (current) drug therapy: Secondary | ICD-10-CM | POA: Insufficient documentation

## 2012-07-01 DIAGNOSIS — R51 Headache: Secondary | ICD-10-CM

## 2012-07-01 DIAGNOSIS — F329 Major depressive disorder, single episode, unspecified: Secondary | ICD-10-CM | POA: Insufficient documentation

## 2012-07-01 DIAGNOSIS — G43909 Migraine, unspecified, not intractable, without status migrainosus: Secondary | ICD-10-CM | POA: Insufficient documentation

## 2012-07-01 DIAGNOSIS — Z7982 Long term (current) use of aspirin: Secondary | ICD-10-CM | POA: Insufficient documentation

## 2012-07-01 DIAGNOSIS — H53149 Visual discomfort, unspecified: Secondary | ICD-10-CM | POA: Insufficient documentation

## 2012-07-01 DIAGNOSIS — F411 Generalized anxiety disorder: Secondary | ICD-10-CM | POA: Insufficient documentation

## 2012-07-01 DIAGNOSIS — R110 Nausea: Secondary | ICD-10-CM | POA: Insufficient documentation

## 2012-07-01 DIAGNOSIS — F3289 Other specified depressive episodes: Secondary | ICD-10-CM | POA: Insufficient documentation

## 2012-07-01 MED ORDER — ZOLPIDEM TARTRATE 5 MG PO TABS: 5.0000 mg | ORAL_TABLET | Freq: Every evening | ORAL | Status: DC | PRN

## 2012-07-01 MED ORDER — SERTRALINE HCL 50 MG PO TABS: 50.0000 mg | ORAL_TABLET | Freq: Every day | ORAL | Status: DC

## 2012-07-01 MED ORDER — BUTORPHANOL TARTRATE 2 MG/ML IJ SOLN
4.0000 mg | Freq: Once | INTRAMUSCULAR | Status: AC
  Administered 2012-07-01: 4 mg via INTRAMUSCULAR
  Filled 2012-07-01: qty 2

## 2012-07-01 MED ORDER — KETOROLAC TROMETHAMINE 60 MG/2ML IM SOLN
60.0000 mg | Freq: Once | INTRAMUSCULAR | Status: AC
  Administered 2012-07-01: 60 mg via INTRAMUSCULAR
  Filled 2012-07-01: qty 2

## 2012-07-01 MED ORDER — PROMETHAZINE HCL 25 MG/ML IJ SOLN
25.0000 mg | Freq: Once | INTRAMUSCULAR | Status: AC
  Administered 2012-07-01: 25 mg via INTRAMUSCULAR
  Filled 2012-07-01: qty 1

## 2012-07-01 MED ORDER — LAMOTRIGINE 100 MG PO TABS: 100.0000 mg | ORAL_TABLET | Freq: Every day | ORAL | Status: DC

## 2012-07-01 NOTE — ED Provider Notes (Signed)
History     CSN: [id number]  Arrival date & time 07/01/12  4098   First MD Initiated Contact with Patient 07/01/12 2010      Chief Complaint  Patient presents with  . Migraine   HPI  History provided by the patient and recent medical chart. Patient is a 46 year old female with history of anxiety, depression and migraine headaches. Patient presents today with complaints of typical unrelieved migraine headache. She reports waking up with migraine headache yesterday. She was seen in emergency room and evaluated for this and given medications. Patient states these medicines did not help. She did try to go home and rest and sleep the headache was unrelieved and was persistent all day today. She denies any changes in her migraine-type headache. She has no other associated complaints. Pain is primarily on the bilateral temporal areas and 4 head. This is typical for her migraines. Patient was formerly seen by a neurologist and was on several preventive medications was discontinued after she lost insurance 4-5 months ago.    Past Medical History  Diagnosis Date  . Depression   . Migraine   . Anxiety     Past Surgical History  Procedure Date  . Dilation and curettage of uterus   . Esophagogastroduodenoscopy 05/15/2012    Procedure: ESOPHAGOGASTRODUODENOSCOPY (EGD);  Surgeon: Beverley Fiedler, MD;  Location: [facility];  Service: Gastroenterology;  Laterality: N/A;    Family History  Problem Relation Age of Onset  . Hypertension Father   . Cancer Mother   . Colon cancer Neg Hx   . Stomach cancer Neg Hx     History  Substance Use Topics  . Smoking status: Never Smoker   . Smokeless tobacco: Never Used  . Alcohol Use: Yes     Comment: rarely    OB History    Grav Para Term Preterm Abortions TAB SAB Ect Mult Living   0               Review of Systems  Constitutional: Negative for fever and chills.  HENT: Negative for neck pain.   Eyes: Positive for photophobia. Negative for  visual disturbance.  Gastrointestinal: Positive for nausea. Negative for vomiting.  Neurological: Positive for headaches. Negative for weakness, light-headedness and numbness.  All other systems reviewed and are negative.    Allergies  Benadryl and Compazine  Home Medications   Current Outpatient Rx  Name  Route  Sig  Dispense  Refill  . GOODY HEADACHE PO   Oral   Take 1 packet by mouth daily as needed. Migraine.         Marland Kitchen BUTALBITAL-APAP-CAFFEINE 50-325-40 MG PO TABS   Oral   Take 2 tablets by mouth every 6 (six) hours as needed. Migraine.         Marland Kitchen PANTOPRAZOLE SODIUM 40 MG PO TBEC   Oral   Take 40 mg by mouth every evening.            BP 140/80  Pulse 111  Temp 98.5 F (36.9 C) (Oral)  Resp 20  SpO2 97%  LMP 06/23/2012  Physical Exam  Nursing note and vitals reviewed. Constitutional: She is oriented to person, place, and time. She appears well-developed and well-nourished. No distress.  HENT:  Head: Normocephalic and atraumatic.  Eyes: Conjunctivae normal and EOM are normal. Pupils are equal, round, and reactive to light.  Neck: Normal range of motion. Neck supple.       No meningeal signs  Cardiovascular: Normal rate  and regular rhythm.   No murmur heard. Pulmonary/Chest: Effort normal and breath sounds normal. No respiratory distress. She has no wheezes. She has no rales.  Abdominal: Soft. There is no tenderness. There is no rigidity, no rebound, no guarding, no CVA tenderness and no tenderness at McBurney's point.  Neurological: She is alert and oriented to person, place, and time. She has normal strength. No cranial nerve deficit or sensory deficit. Gait normal.  Skin: Skin is warm and dry. No rash noted.  Psychiatric: She has a normal mood and affect. Her behavior is normal.    ED Course  Procedures      1. Headache       MDM  8:12 PM patient seen and evaluated. Patient appears uncomfortable but in no acute distress. Mild tachycardia at  triage. Heart normal during physical exam. Patient denies any changes in typical migraine type headache. Not relieved from treatments yesterday in the emergency room. She has normal nonfocal neuro exam   Stadol, Phenergan and Toradol IM ordered.   Patient reports having significant improvements. Patient states that she has been off of her normal medications of Lipitor, Zoloft and Ambien help significantly reduce the frequency of her migraine headaches. At this time and agreed to give prescriptions for short course of these medicines. Patient will plan to followup with her primary care provider for continued management and treatment.     Angus Seller, [place] 07/01/12 [phone]

## 2012-07-01 NOTE — ED Notes (Signed)
Pt presenting to ed with c/o migraine headache pt states she was seen here yesterday for the same. Pt states positive nausea onset yesterday. Pt states she woke up yesterday with a migraine. Pt states sensitivity to light and night. Pt states she's suppose to get 3 shots when she comes to this ER but Md would not give her stadol, tordol, phenergan but she thinks the last time she received dilaudid and it worked.

## 2012-07-02 NOTE — ED Provider Notes (Signed)
Medical screening examination/treatment/procedure(s) were performed by non-physician practitioner and as supervising physician I was immediately available for consultation/collaboration.   Gwyneth Sprout, MD 07/02/12 [phone]

## 2012-07-04 NOTE — ED Provider Notes (Signed)
Medical screening examination/treatment/procedure(s) were performed by non-physician practitioner and as supervising physician I was immediately available for consultation/collaboration.  Jones Skene, M.D.     Jones Skene, MD 07/04/12 1631

## 2012-07-08 NOTE — Addendum Note (Signed)
Addended by: Maple Hudson on: 07/08/2012 01:27 PM   Modules accepted: Level of Service

## 2012-07-13 ENCOUNTER — Emergency Department (HOSPITAL_COMMUNITY)
Admission: EM | Admit: 2012-07-13 | Discharge: 2012-07-13 | Disposition: A | Discharge status: Home / Self Care | Payer: Self-pay | Attending: Emergency Medicine | Admitting: Emergency Medicine

## 2012-07-13 ENCOUNTER — Encounter (HOSPITAL_COMMUNITY): Payer: Self-pay | Admitting: Emergency Medicine

## 2012-07-13 DIAGNOSIS — Z79899 Other long term (current) drug therapy: Secondary | ICD-10-CM | POA: Insufficient documentation

## 2012-07-13 DIAGNOSIS — F411 Generalized anxiety disorder: Secondary | ICD-10-CM | POA: Insufficient documentation

## 2012-07-13 DIAGNOSIS — G43909 Migraine, unspecified, not intractable, without status migrainosus: Secondary | ICD-10-CM | POA: Insufficient documentation

## 2012-07-13 DIAGNOSIS — F3289 Other specified depressive episodes: Secondary | ICD-10-CM | POA: Insufficient documentation

## 2012-07-13 DIAGNOSIS — F329 Major depressive disorder, single episode, unspecified: Secondary | ICD-10-CM | POA: Insufficient documentation

## 2012-07-13 MED ORDER — PROMETHAZINE HCL 25 MG PO TABS: 25.0000 mg | ORAL_TABLET | Freq: Four times a day (QID) | ORAL | Status: DC | PRN

## 2012-07-13 MED ORDER — ZOLPIDEM TARTRATE 10 MG PO TABS: 10.0000 mg | ORAL_TABLET | Freq: Every evening | ORAL | Status: DC | PRN

## 2012-07-13 MED ORDER — OXYCODONE-ACETAMINOPHEN 5-325 MG PO TABS
2.0000 | ORAL_TABLET | Freq: Once | ORAL | Status: AC
  Administered 2012-07-13: 2 via ORAL
  Filled 2012-07-13: qty 2

## 2012-07-13 MED ORDER — PROMETHAZINE HCL 25 MG/ML IJ SOLN
25.0000 mg | Freq: Once | INTRAMUSCULAR | Status: AC
  Administered 2012-07-13: 25 mg via INTRAMUSCULAR
  Filled 2012-07-13: qty 1

## 2012-07-13 MED ORDER — BUTORPHANOL TARTRATE 10 MG/ML NA SOLN: 1.0000 | NASAL | Status: DC | PRN

## 2012-07-13 MED ORDER — BUTORPHANOL TARTRATE 2 MG/ML IJ SOLN
4.0000 mg | Freq: Once | INTRAMUSCULAR | Status: AC
  Administered 2012-07-13: 4 mg via INTRAMUSCULAR
  Filled 2012-07-13: qty 2

## 2012-07-13 NOTE — ED Notes (Signed)
Pt states migraine since this morning.  Pt has been here 26 times in the last 6 months for the same complaint.  Pt is very particular about the medications that she wants each time she is here.  Pt wants 4 of stadol, 25 of phenergan, and 60 of toradol.  Pt states that she has an endoscopy scheduled for jan 2 and they told her that she can't take any goody powders for 7 days prior.

## 2012-07-13 NOTE — ED Provider Notes (Signed)
History     CSN: [id number]  Arrival date & time 07/13/12  1747   First MD Initiated Contact with Patient 07/13/12 1820      Chief Complaint  Patient presents with  . Migraine    (Consider location/radiation/quality/duration/timing/severity/associated sxs/prior treatment) HPI Comments: Patient presents with migraine like her other migraines. Of note patient has been here more than 20 times in 6 months for headache. She requests a personalized cocktail. She states that the headache is 10/10, right sided, with associated photophobia and nausea. She was followed by Neurology Dr. Daphine Deutscher until she lost insurance. Denies fever or chills. Denies loss of vision. Denies neck stiffness. Denies that this is the worst headache of her life.  The history is provided by the patient. No language interpreter was used.    Past Medical History  Diagnosis Date  . Depression   . Migraine   . Anxiety     Past Surgical History  Procedure Date  . Dilation and curettage of uterus   . Esophagogastroduodenoscopy 05/15/2012    Procedure: ESOPHAGOGASTRODUODENOSCOPY (EGD);  Surgeon: Beverley Fiedler, MD;  Location: [facility];  Service: Gastroenterology;  Laterality: N/A;    Family History  Problem Relation Age of Onset  . Hypertension Father   . Cancer Mother   . Colon cancer Neg Hx   . Stomach cancer Neg Hx     History  Substance Use Topics  . Smoking status: Never Smoker   . Smokeless tobacco: Never Used  . Alcohol Use: Yes     Comment: rarely    OB History    Grav Para Term Preterm Abortions TAB SAB Ect Mult Living   0               Review of Systems  Constitutional: Negative for fever and chills.  Eyes: Positive for photophobia.  Gastrointestinal: Positive for nausea. Negative for vomiting.  Neurological: Positive for headaches.    Allergies  Benadryl and Compazine  Home Medications   Current Outpatient Rx  Name  Route  Sig  Dispense  Refill  . BUTORPHANOL TARTRATE 10  MG/ML NA SOLN   Nasal   Place 1 spray into the nose every 4 (four) hours as needed. For migraines         . PANTOPRAZOLE SODIUM 40 MG PO TBEC   Oral   Take 40 mg by mouth every evening.          Marland Kitchen SERTRALINE HCL 50 MG PO TABS   Oral   Take 1 tablet (50 mg total) by mouth daily.   15 tablet   0   . ZOLPIDEM TARTRATE 10 MG PO TABS   Oral   Take 10 mg by mouth at bedtime.         Marlin Canary HEADACHE PO   Oral   Take 1 packet by mouth daily as needed. Migraine.           BP 151/83  Pulse 120  Temp 98.7 F (37.1 C) (Oral)  Resp 18  SpO2 99%  LMP 06/23/2012  Physical Exam  Nursing note and vitals reviewed. Constitutional: She is oriented to person, place, and time. She appears well-developed and well-nourished.  HENT:  Head: Normocephalic and atraumatic.  Mouth/Throat: Oropharynx is clear and moist.  Eyes: Conjunctivae normal and EOM are normal. No scleral icterus.  Neck: Normal range of motion. Neck supple.  Cardiovascular: Normal rate, regular rhythm and normal heart sounds.   Pulmonary/Chest: Effort normal and breath sounds normal.  Abdominal: Soft. Bowel sounds are normal. There is no tenderness.  Neurological: She is alert and oriented to person, place, and time. No cranial nerve deficit. She exhibits normal muscle tone. Coordination normal.       Cranial nerves II-XII grossly intact. No pronator drift. Negative romberg.  Skin: Skin is warm and dry.    ED Course  Procedures (including critical care time)  Labs Reviewed - No data to display No results found.   1. Migraine       MDM  Patient presented with migraine similar to other migraines. No neurologic deficits. Improved with Stadol and phenergan. Not given Toradol due to history of gastric ulcer and upcoming endoscopy. Discharged with return precautions. No red flags for CVA or acute intercranial process.        Pixie Casino, PA-C 07/13/12 1909

## 2012-07-14 NOTE — ED Provider Notes (Signed)
Medical screening examination/treatment/procedure(s) were performed by non-physician practitioner and as supervising physician I was immediately available for consultation/collaboration.   Flint Melter, MD 07/14/12 Marlyne Beards

## 2012-07-15 ENCOUNTER — Telehealth: Payer: Self-pay | Admitting: Internal Medicine

## 2012-07-15 NOTE — Telephone Encounter (Signed)
Informed pt okay to proceed with EGD as scheduled

## 2012-07-18 ENCOUNTER — Ambulatory Visit (AMBULATORY_SURGERY_CENTER): Payer: Self-pay | Admitting: Internal Medicine

## 2012-07-18 ENCOUNTER — Encounter: Payer: Self-pay | Admitting: Internal Medicine

## 2012-07-18 VITALS — BP 130/78 | HR 91 | Temp 98.8°F | Resp 15 | Ht 67.0 in | Wt 226.0 lb

## 2012-07-18 DIAGNOSIS — K279 Peptic ulcer, site unspecified, unspecified as acute or chronic, without hemorrhage or perforation: Secondary | ICD-10-CM

## 2012-07-18 DIAGNOSIS — R1314 Dysphagia, pharyngoesophageal phase: Secondary | ICD-10-CM

## 2012-07-18 DIAGNOSIS — K269 Duodenal ulcer, unspecified as acute or chronic, without hemorrhage or perforation: Secondary | ICD-10-CM

## 2012-07-18 DIAGNOSIS — R1310 Dysphagia, unspecified: Secondary | ICD-10-CM

## 2012-07-18 DIAGNOSIS — K222 Esophageal obstruction: Secondary | ICD-10-CM

## 2012-07-18 DIAGNOSIS — Q393 Congenital stenosis and stricture of esophagus: Secondary | ICD-10-CM

## 2012-07-18 MED ORDER — SODIUM CHLORIDE 0.9 % IV SOLN: 500.0000 mL | INTRAVENOUS | Status: DC

## 2012-07-18 NOTE — Progress Notes (Signed)
No complaints noted in the recovery room. Maw  Patient did not experience any of the following events: a burn prior to discharge; a fall within the facility; wrong site/side/patient/procedure/implant event; or a hospital transfer or hospital admission upon discharge from the facility. (G8907) Patient did not have preoperative order for IV antibiotic SSI prophylaxis. (G8918)  

## 2012-07-18 NOTE — Patient Instructions (Addendum)
Esophageal dilatation due to esophageal stricture.  Please follow the dilatation diet the rest of the day.  Handouts were given to your care partner on esophageal dilatation diet and hiatal hernia.  Continue taking pantoprazole 40 mg daily.  This medication is best taken 30 minutes to one hour before first meal of the day.  Please avoid NSAIDs as much as possible (discuss other migraine therapy with PCP or neurologist).  Consider dilatation of duodenal stricture if symptoms develop (post-prandial pain, nausea or vomiting).  You may resume your other current medications today.  Please call if any questions or concerns.     YOU HAD AN ENDOSCOPIC PROCEDURE TODAY AT THE [facility]: Refer to the procedure report that was given to you for any specific questions about what was found during the examination.  If the procedure report does not answer your questions, please call your gastroenterologist to clarify.  If you requested that your care partner not be given the details of your procedure findings, then the procedure report has been included in a sealed envelope for you to review at your convenience later.  YOU SHOULD EXPECT: Some feelings of bloating in the abdomen. Passage of more gas than usual.  Walking can help get rid of the air that was put into your GI tract during the procedure and reduce the bloating. If you had a lower endoscopy (such as a colonoscopy or flexible sigmoidoscopy) you may notice spotting of blood in your stool or on the toilet paper. If you underwent a bowel prep for your procedure, then you may not have a normal bowel movement for a few days.  DIET:    Drink plenty of fluids but you should avoid alcoholic beverages for 24 hours.  Please follow the dilatation diet the rest of the day.  ACTIVITY: Your care partner should take you home directly after the procedure.  You should plan to take it easy, moving slowly for the rest of the day.  You can resume normal activity the  day after the procedure however you should NOT DRIVE or use heavy machinery for 24 hours (because of the sedation medicines used during the test).    SYMPTOMS TO REPORT IMMEDIATELY: A gastroenterologist can be reached at any hour.  During normal business hours, 8:30 AM to 5:00 PM Monday through Friday, call [phone].  After hours and on weekends, please call the GI answering service at [phone] who will take a message and have the physician on call contact you.     Following upper endoscopy (EGD)  Vomiting of blood or coffee ground material  New chest pain or pain under the shoulder blades  Painful or persistently difficult swallowing  New shortness of breath  Fever of 100F or higher  Black, tarry-looking stools  FOLLOW UP: If any biopsies were taken you will be contacted by phone or by letter within the next 1-3 weeks.  Call your gastroenterologist if you have not heard about the biopsies in 3 weeks.  Our staff will call the home number listed on your records the next business day following your procedure to check on you and address any questions or concerns that you may have at that time regarding the information given to you following your procedure. This is a courtesy call and so if there is no answer at the home number and we have not heard from you through the emergency physician on call, we will assume that you have returned to your regular daily activities  without incident.  SIGNATURES/CONFIDENTIALITY: You and/or your care partner have signed paperwork which will be entered into your electronic medical record.  These signatures attest to the fact that that the information above on your After Visit Summary has been reviewed and is understood.  Full responsibility of the confidentiality of this discharge information lies with you and/or your care-partner.

## 2012-07-18 NOTE — Op Note (Signed)
 [facility] [address] [place], [address]   ENDOSCOPY PROCEDURE REPORT  PATIENT: Maureen Chen, Maureen Chen  MR#: [id number] BIRTHDATE: August 03, 1965 , 46  yrs. old GENDER: Female ENDOSCOPIST: Beverley Fiedler, MD ASSISTANT:   Morrell Riddle, RN PROCEDURE DATE:  07/18/2012 PROCEDURE:   EGD with balloon dilatation ASA CLASS:   Class III INDICATIONS: esophageal , solid-food dysphagia. MEDICATIONS: MAC sedation, administered by CRNA and propofol (Diprivan) 350mg  IV TOPICAL ANESTHETIC:   Cetacaine Spray  DESCRIPTION OF PROCEDURE:   After the risks benefits and alternatives of the procedure were thoroughly explained, informed consent was obtained.  The [id number]  endoscope was introduced through the mouth  and advanced to the second portion of the duodenum ,      The instrument was slowly withdrawn as the mucosa was carefully examined.   ESOPHAGUS: A Schatzki ring was found 34 cm from the incisors. During this examination the upper endoscope passed without resistance.  The esophagus was otherwise normal.  STOMACH: A 4 cm hiatus hernia was found.   Mild gastropathy was found in the gastric antrum.  There were erosions present.  DUODENUM: Non-traversable, benign-appearing and intrinsic acquired stenosis was found in the 2nd part of the duodenum just distal to the duodenal bulb.  This is felt secondary to peptic ulcer disease. The duodenal ulcer disease/inflammation was considerably improved compared to EGD in October 2013.  Esophageal dilation was then performed at the gastroesophageal junction with TTS balloon.  Dilator:Balloon Size:12 and 13.5   Heme:yes  Appearance:satisfactory and successful with mild/small mucosa tear at ring  COMPLICATIONS: There were no complications.  ENDOSCOPIC IMPRESSION: 1.   Schatzki ring was found 34 cm from the incisors. Dilation with balloon to 13.5 mm 2.   The esophagus was otherwise normal. 3.   Hiatus hernia was found 4.    Gastropathy was found in the gastric antrum 5.   Acquired/peptic stenosis was found in the 2nd part of the duodenum    RECOMMENDATIONS: 1.  Repeat esophageal dilation as symptoms warrant. 2.  Continue pantoprazole 40 mg daily.  Best taken 30 minutes to one hour before 1st meal of the day 3.  Avoid NSAIDs as much as possible (discuss other migraine therapy with PCP or neurology) 4.  Consider dilation of duodenal stricture if symptoms develop (post-prandial abdominal pain, nausea, vomiting).   eSigned:  Beverley Fiedler, MD 07/18/2012 3:48 PM   CC:The Patient and Ilene Qua, MD  PATIENT NAME:  [name], [name] MR#: [id number]

## 2012-07-19 ENCOUNTER — Telehealth: Payer: Self-pay | Admitting: *Deleted

## 2012-07-19 NOTE — Telephone Encounter (Signed)
  Follow up Call-  Call back number 07/18/2012  Post procedure Call Back phone  # [phone]  Permission to leave phone message Yes     Patient questions:  Do you have a fever, pain , or abdominal swelling? no Pain Score  0 *  Have you tolerated food without any problems? yes  Have you been able to return to your normal activities? yes  Do you have any questions about your discharge instructions: Diet   no Medications  no Follow up visit  no  Do you have questions or concerns about your Care? no  Actions: * If pain score is 4 or above: No action needed, pain <4.

## 2012-07-25 ENCOUNTER — Encounter (HOSPITAL_COMMUNITY): Payer: Self-pay | Admitting: *Deleted

## 2012-07-25 ENCOUNTER — Emergency Department (HOSPITAL_COMMUNITY)
Admission: EM | Admit: 2012-07-25 | Discharge: 2012-07-25 | Disposition: A | Discharge status: Home / Self Care | Payer: Self-pay | Attending: Emergency Medicine | Admitting: Emergency Medicine

## 2012-07-25 DIAGNOSIS — Z79899 Other long term (current) drug therapy: Secondary | ICD-10-CM | POA: Insufficient documentation

## 2012-07-25 DIAGNOSIS — Z765 Malingerer [conscious simulation]: Secondary | ICD-10-CM

## 2012-07-25 DIAGNOSIS — R000 Tachycardia, unspecified: Secondary | ICD-10-CM | POA: Insufficient documentation

## 2012-07-25 DIAGNOSIS — R110 Nausea: Secondary | ICD-10-CM | POA: Insufficient documentation

## 2012-07-25 DIAGNOSIS — H53149 Visual discomfort, unspecified: Secondary | ICD-10-CM | POA: Insufficient documentation

## 2012-07-25 DIAGNOSIS — F1920 Other psychoactive substance dependence, uncomplicated: Secondary | ICD-10-CM | POA: Insufficient documentation

## 2012-07-25 DIAGNOSIS — F341 Dysthymic disorder: Secondary | ICD-10-CM | POA: Insufficient documentation

## 2012-07-25 DIAGNOSIS — G43909 Migraine, unspecified, not intractable, without status migrainosus: Secondary | ICD-10-CM | POA: Insufficient documentation

## 2012-07-25 NOTE — ED Provider Notes (Signed)
History     CSN: [id number]  Arrival date & time 07/25/12  1146   First MD Initiated Contact with Patient 07/25/12 1210      Chief Complaint  Patient presents with  . Migraine    (Consider location/radiation/quality/duration/timing/severity/associated sxs/prior treatment) HPI Comments: Maureen Chen is a 47 y.o. female with a history of chronic migraines, depression, anxiety, and tachycardia presents to the emergency department complaining of migraine.  Patient is known very well to myself with 25 visits to the emergency department in the last 6 months for similar presentation.  Patient reports that since she was "let go" from her neurologist office she has needed to come to the emergency department for migraine relief.  She reports that she does not have the funds to pay for primary care or neurology.  Patient reports migraine is like typical presentation with onset last night and associated with photophobia and nausea, but no emesis. Pt requests her usual migraine cocktail of Stadol, Phenergan, +/- Dilaudid. In addition she reports that she recently had endoscopy by Dr. Rhea Belton showing that her ulcers have healed.   The history is provided by the patient.    Past Medical History  Diagnosis Date  . Depression   . Migraine   . Anxiety     Past Surgical History  Procedure Date  . Dilation and curettage of uterus   . Esophagogastroduodenoscopy 05/15/2012    Procedure: ESOPHAGOGASTRODUODENOSCOPY (EGD);  Surgeon: Beverley Fiedler, MD;  Location: [facility];  Service: Gastroenterology;  Laterality: N/A;    Family History  Problem Relation Age of Onset  . Hypertension Father   . Cancer Mother   . Colon cancer Neg Hx   . Stomach cancer Neg Hx     History  Substance Use Topics  . Smoking status: Never Smoker   . Smokeless tobacco: Never Used  . Alcohol Use: Yes     Comment: rarely    OB History    Grav Para Term Preterm Abortions TAB SAB Ect Mult Living   0                Review of Systems  Constitutional: Positive for activity change. Negative for fever, chills, diaphoresis and fatigue.  HENT: Negative for ear pain, congestion, facial swelling, neck pain, neck stiffness, sinus pressure and tinnitus.   Eyes: Positive for photophobia. Negative for redness and visual disturbance.  Respiratory: Negative for cough, shortness of breath, wheezing and stridor.   Cardiovascular: Negative for chest pain.  Gastrointestinal: Positive for nausea. Negative for vomiting and abdominal pain.  Musculoskeletal: Negative for myalgias and gait problem.  Skin: Negative for rash.  Neurological: Positive for headaches. Negative for dizziness, syncope, speech difficulty, weakness, light-headedness and numbness.       No bowel or bladder incontinence.  Psychiatric/Behavioral: Negative for confusion.  All other systems reviewed and are negative.    Allergies  Benadryl and Compazine  Home Medications   Current Outpatient Rx  Name  Route  Sig  Dispense  Refill  . GOODY HEADACHE PO   Oral   Take 1 packet by mouth daily as needed. Migraine.         Marland Kitchen BUTORPHANOL TARTRATE 10 MG/ML NA SOLN   Nasal   Place 1 spray into the nose every 4 (four) hours as needed. For migraines         . PANTOPRAZOLE SODIUM 40 MG PO TBEC   Oral   Take 40 mg by mouth every evening.          Marland Kitchen  PROMETHAZINE HCL 25 MG PO TABS   Oral   Take 1 tablet (25 mg total) by mouth every 6 (six) hours as needed for nausea.   30 tablet   0   . SERTRALINE HCL 50 MG PO TABS   Oral   Take 1 tablet (50 mg total) by mouth daily.   15 tablet   0   . ZOLPIDEM TARTRATE 10 MG PO TABS   Oral   Take 1 tablet (10 mg total) by mouth at bedtime as needed for sleep.   30 tablet   0     BP 151/95  Pulse 130  Temp 98.5 F (36.9 C) (Oral)  Resp 18  SpO2 100%  LMP 07/15/2012  Physical Exam  Nursing note and vitals reviewed. Constitutional: She is oriented to person, place, and time. She appears  well-developed and well-nourished. No distress.  HENT:  Head: Normocephalic and atraumatic.  Eyes: Conjunctivae normal and EOM are normal. Pupils are equal, round, and reactive to light. No scleral icterus.  Neck: Normal range of motion.       Neck supple with no nuchal rigidity, full pain free ROM. No carotid bruit  Cardiovascular: Normal rate, regular rhythm, normal heart sounds and intact distal pulses.   Pulmonary/Chest: Effort normal and breath sounds normal. No respiratory distress.  Musculoskeletal: Normal range of motion.  Neurological: She is alert and oriented to person, place, and time. She has normal strength.       CN III-XII intact, good coordination, normal gait, strength 5/5 bilaterally, intact distal sensation.  Skin: Skin is warm and dry.       No rash, non diaphoretic    ED Course  Procedures (including critical care time)  Labs Reviewed - No data to display No results found.   No diagnosis found.  Consult: Dr. Daphine Deutscher, [facility] to verify medications pt was on at time of discontinued treatment. Dr. Daphine Deutscher reported that the patient was dismissed from his practice because she tried getting controlled substances from another provider, narcotic of choice being Stadol. He had tried prophylactic Lamictal and offered Imitrex, however pt only wanted stadol. Provider then would allow 1 bottle intranasal Stadol per month only for severe migraines, which patient did not follow seeing other providers and the [facility] ER for more.    Consult: Case Manager, assistant with finding patient PCP to write for prophylactic migraine medications. Selena Batten states that pt has been given information many times, and confirmed 4 times that she has given resources of which pt has been non-compliant with follow up.    MDM  Migraine, Drug seeking behavior  Pt presents to ER w her "typical migraine" requesting a migraine cocktail consisting of narcotics Stadol & Dilaudid with Phenergan. No  focal neuro deficits on exam. Pt with tachycardia, which is present on past visits as well. Consult to patients past neurologist and case manager as above. Pt presentation is consistent with drug seeking medication. I have offered pt other migraine treatments for which she has refused.        Jaci Carrel, [place] 07/25/12 [phone]

## 2012-07-25 NOTE — ED Notes (Signed)
Pt reports migraine h/a which started last night, took a pack of goody's and went to bed.  Reports waking up with a h/a this am.  Took some more goody's and used an ice pack without relief.  Pt also reports nausea but denies any vomiting at this time.

## 2012-07-25 NOTE — ED Provider Notes (Signed)
Medical screening examination/treatment/procedure(s) were performed by non-physician practitioner and as supervising physician I was immediately available for consultation/collaboration.   Rolan Bucco, MD 07/25/12 [phone]

## 2012-07-25 NOTE — Progress Notes (Signed)
[facility] ED CM consulted by ED PA-C Paz to assist with finding a local pcp Pt states Dr Carolin Coy is in [place] she has seen this MD prevoiusly States she got laid off in December 2013 CM provided a referral to [facility] [employer] [facility] liaison Pt listed with no insurance coverage CM and [facility] [employer] spoke with pt.  Pt offered services to assist with finding a [place] self pay provider & health reform information Updated ED PA-C about pt being seen by CM and [facility] liaison has seen this pt at least 4 times in last 6 months to provided the same information for self pay [place] pcps, health reform information,www.needymeds.org, discounted pharmacies, and other [place] resources such as financial assistance, DSS and  health department Reviewed Health connect number to assist with finding self pay provider close to pt's residence. Reviewed resources for [facility], general medical clinics, [facility] out patient [facility]ies, housing, and other resources in [employer]. Each time pt voiced understanding and appreciation of resources provided. PA-C, CM and liaison noting pt with frequent requests for pain medications for various pain issues

## 2012-07-26 ENCOUNTER — Encounter (HOSPITAL_COMMUNITY): Payer: Self-pay | Admitting: Emergency Medicine

## 2012-07-26 ENCOUNTER — Emergency Department (HOSPITAL_COMMUNITY)
Admission: EM | Admit: 2012-07-26 | Discharge: 2012-07-26 | Disposition: A | Discharge status: Home / Self Care | Payer: Self-pay | Attending: Emergency Medicine | Admitting: Emergency Medicine

## 2012-07-26 DIAGNOSIS — G43909 Migraine, unspecified, not intractable, without status migrainosus: Secondary | ICD-10-CM | POA: Insufficient documentation

## 2012-07-26 DIAGNOSIS — F3289 Other specified depressive episodes: Secondary | ICD-10-CM | POA: Insufficient documentation

## 2012-07-26 DIAGNOSIS — H5710 Ocular pain, unspecified eye: Secondary | ICD-10-CM | POA: Insufficient documentation

## 2012-07-26 DIAGNOSIS — Z79899 Other long term (current) drug therapy: Secondary | ICD-10-CM | POA: Insufficient documentation

## 2012-07-26 DIAGNOSIS — F329 Major depressive disorder, single episode, unspecified: Secondary | ICD-10-CM | POA: Insufficient documentation

## 2012-07-26 DIAGNOSIS — H53149 Visual discomfort, unspecified: Secondary | ICD-10-CM | POA: Insufficient documentation

## 2012-07-26 DIAGNOSIS — F411 Generalized anxiety disorder: Secondary | ICD-10-CM | POA: Insufficient documentation

## 2012-07-26 MED ORDER — KETOROLAC TROMETHAMINE 60 MG/2ML IM SOLN
60.0000 mg | Freq: Once | INTRAMUSCULAR | Status: AC
  Administered 2012-07-26: 60 mg via INTRAMUSCULAR
  Filled 2012-07-26: qty 2

## 2012-07-26 MED ORDER — NICOTINE 21 MG/24HR TD PT24
MEDICATED_PATCH | TRANSDERMAL | Status: AC
  Filled 2012-07-26: qty 1

## 2012-07-26 MED ORDER — BUTORPHANOL TARTRATE 2 MG/ML IJ SOLN
INTRAMUSCULAR | Status: AC
  Administered 2012-07-26: 1 mg via INTRAMUSCULAR
  Filled 2012-07-26: qty 1

## 2012-07-26 MED ORDER — BUTORPHANOL TARTRATE 1 MG/ML IJ SOLN: 1.0000 mg | Freq: Once | INTRAMUSCULAR | Status: AC

## 2012-07-26 MED ORDER — PROMETHAZINE HCL 25 MG/ML IJ SOLN
25.0000 mg | Freq: Once | INTRAMUSCULAR | Status: AC
  Administered 2012-07-26: 25 mg via INTRAMUSCULAR
  Filled 2012-07-26: qty 1

## 2012-07-26 NOTE — ED Provider Notes (Signed)
History     CSN: [id number]  Arrival date & time 07/26/12  1008   First MD Initiated Contact with Patient 07/26/12 1020      Chief Complaint  Patient presents with  . Migraine    (Consider location/radiation/quality/duration/timing/severity/associated sxs/prior treatment) HPI Comments: Patient comes to the ER for evaluation of migraine headache. Patient reports that her headache started yesterday and has progressively worsened. She reports a throbbing pain behind her eyes, worsened by bright lights. This is typical of her previous migraines. She was treated in the ER yesterday, but did not get any improvement.  Patient is a 47 y.o. female presenting with migraines.  Migraine Associated symptoms include headaches.    Past Medical History  Diagnosis Date  . Depression   . Migraine   . Anxiety     Past Surgical History  Procedure Date  . Dilation and curettage of uterus   . Esophagogastroduodenoscopy 05/15/2012    Procedure: ESOPHAGOGASTRODUODENOSCOPY (EGD);  Surgeon: Beverley Fiedler, MD;  Location: [facility];  Service: Gastroenterology;  Laterality: N/A;    Family History  Problem Relation Age of Onset  . Hypertension Father   . Cancer Mother   . Colon cancer Neg Hx   . Stomach cancer Neg Hx     History  Substance Use Topics  . Smoking status: Never Smoker   . Smokeless tobacco: Never Used  . Alcohol Use: Yes     Comment: rarely    OB History    Grav Para Term Preterm Abortions TAB SAB Ect Mult Living   0               Review of Systems  Eyes: Positive for photophobia.  Neurological: Positive for headaches.  All other systems reviewed and are negative.    Allergies  Benadryl and Compazine  Home Medications   Current Outpatient Rx  Name  Route  Sig  Dispense  Refill  . GOODY HEADACHE PO   Oral   Take 1 packet by mouth daily as needed. Migraine.         Marland Kitchen PANTOPRAZOLE SODIUM 40 MG PO TBEC   Oral   Take 40 mg by mouth every evening.            Marland Kitchen SERTRALINE HCL 50 MG PO TABS   Oral   Take 1 tablet (50 mg total) by mouth daily.   15 tablet   0   . ZOLPIDEM TARTRATE 10 MG PO TABS   Oral   Take 1 tablet (10 mg total) by mouth at bedtime as needed for sleep.   30 tablet   0     BP 157/84  Pulse 120  Temp 98.9 F (37.2 C) (Oral)  Resp 20  SpO2 100%  LMP 07/15/2012  Physical Exam  Constitutional: She is oriented to person, place, and time. She appears well-developed and well-nourished. No distress.  HENT:  Head: Normocephalic and atraumatic.  Right Ear: Hearing normal.  Nose: Nose normal.  Mouth/Throat: Oropharynx is clear and moist and mucous membranes are normal.  Eyes: Conjunctivae normal and EOM are normal. Pupils are equal, round, and reactive to light.  Neck: Normal range of motion. Neck supple.  Cardiovascular: Normal rate, regular rhythm, S1 normal and S2 normal.  Exam reveals no gallop and no friction rub.   No murmur heard. Pulmonary/Chest: Effort normal and breath sounds normal. No respiratory distress. She exhibits no tenderness.  Abdominal: Soft. Normal appearance and bowel sounds are normal. There is no hepatosplenomegaly.  There is no tenderness. There is no rebound, no guarding, no tenderness at McBurney's point and negative Murphy's sign. No hernia.  Musculoskeletal: Normal range of motion.  Neurological: She is alert and oriented to person, place, and time. She has normal strength. No cranial nerve deficit or sensory deficit. Coordination normal. GCS eye subscore is 4. GCS verbal subscore is 5. GCS motor subscore is 6.  Skin: Skin is warm, dry and intact. No rash noted. No cyanosis.  Psychiatric: She has a normal mood and affect. Her speech is normal and behavior is normal. Thought content normal.    ED Course  Procedures (including critical care time)  Labs Reviewed - No data to display No results found.   1. Migraine       MDM  Patient presents with migraine headache. She has a history  of chronic recurrent migraines and her symptoms are identical to previous migraines. She does not require any workup because of this. Patient treated and will be discharged, followup with her doctor as needed.        Gilda Crease, MD 07/26/12 1037

## 2012-07-26 NOTE — ED Notes (Addendum)
Pt presenting to ed with c/o migraine headache. Pt states she was here yesterday for the same pt states "I don't want to see L. Paz the PA that I saw yesterday". Pt states she's having sharp pain with positive nausea. Pt denies vomiting pt states she is having sensitivity to light. Pt states she gets stadol 4mg , phenergan 25mg  and tordal 60mg  all IM and she did not get these yesterday. Pt states she starts to feel relief in about with these 3 medications.

## 2012-08-07 ENCOUNTER — Encounter (HOSPITAL_COMMUNITY): Payer: Self-pay | Admitting: Emergency Medicine

## 2012-08-07 ENCOUNTER — Emergency Department (HOSPITAL_COMMUNITY)
Admission: EM | Admit: 2012-08-07 | Discharge: 2012-08-07 | Disposition: A | Discharge status: Home / Self Care | Payer: Self-pay | Attending: Emergency Medicine | Admitting: Emergency Medicine

## 2012-08-07 DIAGNOSIS — Z79899 Other long term (current) drug therapy: Secondary | ICD-10-CM | POA: Insufficient documentation

## 2012-08-07 DIAGNOSIS — G43909 Migraine, unspecified, not intractable, without status migrainosus: Secondary | ICD-10-CM

## 2012-08-07 DIAGNOSIS — G43709 Chronic migraine without aura, not intractable, without status migrainosus: Secondary | ICD-10-CM | POA: Insufficient documentation

## 2012-08-07 DIAGNOSIS — Z765 Malingerer [conscious simulation]: Secondary | ICD-10-CM | POA: Insufficient documentation

## 2012-08-07 DIAGNOSIS — F411 Generalized anxiety disorder: Secondary | ICD-10-CM | POA: Insufficient documentation

## 2012-08-07 DIAGNOSIS — F3289 Other specified depressive episodes: Secondary | ICD-10-CM | POA: Insufficient documentation

## 2012-08-07 DIAGNOSIS — F329 Major depressive disorder, single episode, unspecified: Secondary | ICD-10-CM | POA: Insufficient documentation

## 2012-08-07 HISTORY — DX: Malingerer (conscious simulation): Z76.5

## 2012-08-07 MED ORDER — KETOROLAC TROMETHAMINE 60 MG/2ML IM SOLN
60.0000 mg | Freq: Once | INTRAMUSCULAR | Status: AC
  Administered 2012-08-07: 60 mg via INTRAMUSCULAR
  Filled 2012-08-07: qty 2

## 2012-08-07 MED ORDER — HYDROXYZINE HCL 50 MG/ML IM SOLN
50.0000 mg | Freq: Four times a day (QID) | INTRAMUSCULAR | Status: DC | PRN
  Administered 2012-08-07: 50 mg via INTRAMUSCULAR
  Filled 2012-08-07: qty 1

## 2012-08-07 MED ORDER — METOCLOPRAMIDE HCL 5 MG/ML IJ SOLN
10.0000 mg | Freq: Once | INTRAMUSCULAR | Status: AC
  Administered 2012-08-07: 10 mg via INTRAVENOUS
  Filled 2012-08-07: qty 2

## 2012-08-07 NOTE — ED Notes (Signed)
Pt states that she has been having a headache since yesterday.  Pt has been dx w/ drug seeking behavior.  Pt is very specific about what she wants.  4 mg stadol, 60 mg toradol, and 25 mg phenergan.

## 2012-08-07 NOTE — ED Provider Notes (Signed)
History     CSN: [id number]  Arrival date & time 08/07/12  1150   First MD Initiated Contact with Patient 08/07/12 1244      Chief Complaint  Patient presents with  . Headache    (Consider location/radiation/quality/duration/timing/severity/associated sxs/prior treatment) HPI Maureen Chen is a 47 y.o. female who presents with complaint of headache. Pt has hx of chronic migraines. States this time started yesterday. No pain relief with Goody's powder at home. Feels like typical migraine. No head injury. No fever, chills, malaise.  Pain is in the front left area. Sharp. Associated with photophobia, nausea. Unable to see neurologist  bc lost insurance. States her PCP will not see her for her migraines.    Past Medical History  Diagnosis Date  . Depression   . Migraine   . Anxiety   . Drug-seeking behavior     Past Surgical History  Procedure Date  . Dilation and curettage of uterus   . Esophagogastroduodenoscopy 05/15/2012    Procedure: ESOPHAGOGASTRODUODENOSCOPY (EGD);  Surgeon: Beverley Fiedler, MD;  Location: [facility];  Service: Gastroenterology;  Laterality: N/A;    Family History  Problem Relation Age of Onset  . Hypertension Father   . Cancer Mother   . Colon cancer Neg Hx   . Stomach cancer Neg Hx     History  Substance Use Topics  . Smoking status: Never Smoker   . Smokeless tobacco: Never Used  . Alcohol Use: Yes     Comment: rarely    OB History    Grav Para Term Preterm Abortions TAB SAB Ect Mult Living   0               Review of Systems  Constitutional: Negative for fever and chills.  Eyes: Positive for photophobia. Negative for pain and visual disturbance.  Respiratory: Negative.   Cardiovascular: Negative.   Gastrointestinal: Positive for nausea.  Neurological: Positive for dizziness and headaches.  All other systems reviewed and are negative.    Allergies  Benadryl and Compazine  Home Medications   Current Outpatient Rx  Name   Route  Sig  Dispense  Refill  . GOODY HEADACHE PO   Oral   Take 1 packet by mouth daily as needed. Migraine.         Marland Kitchen PANTOPRAZOLE SODIUM 40 MG PO TBEC   Oral   Take 40 mg by mouth every evening.          Marland Kitchen SERTRALINE HCL 50 MG PO TABS   Oral   Take 1 tablet (50 mg total) by mouth daily.   15 tablet   0     BP 165/83  Pulse 116  Temp 99.3 F (37.4 C) (Oral)  Resp 18  SpO2 98%  LMP 07/15/2012  Physical Exam  Nursing note and vitals reviewed. Constitutional: She is oriented to person, place, and time. She appears well-developed and well-nourished. No distress.       Pt appears in no distress. Smiling, laughing in the room.  HENT:  Head: Normocephalic and atraumatic.  Right Ear: External ear normal.  Left Ear: External ear normal.  Nose: Nose normal.  Mouth/Throat: Oropharynx is clear and moist.       TMs normal bilaterally  Eyes: Conjunctivae normal and EOM are normal. Pupils are equal, round, and reactive to light.  Neck: Normal range of motion. Neck supple.  Cardiovascular: Normal rate, regular rhythm and normal heart sounds.   Pulmonary/Chest: Effort normal and breath sounds normal.  No respiratory distress. She has no wheezes. She has no rales.  Musculoskeletal: She exhibits no edema.  Neurological: She is alert and oriented to person, place, and time.       5/5 and equal upper and lower extremity strength bilaterally. Equal grip strength bilaterally. Normal finger to nose and heel to shin.   Skin: Skin is warm and dry.    ED Course  Procedures (including critical care time)  Labs Reviewed - No data to display No results found.   1. Migraine headache       MDM  PT with chronic migraines. She has had 27 visits in the last 6 months for the same. She requests stadol, toradol, phenergan. I am going to treat her headache with toradol 60mg  im, reglan 10mg  IM, vistaril here in ED. She will be d/c home with follow up . She is non toxic appearing, no signs of  of SAH or meningismus.   Filed Vitals:   08/07/12 1222  BP: 165/83  Pulse: 116  Temp: 99.3 F (37.4 C)  Resp: [address] A Newton Frutiger, PA 08/07/12 1311

## 2012-08-08 ENCOUNTER — Emergency Department (HOSPITAL_COMMUNITY)
Admission: EM | Admit: 2012-08-08 | Discharge: 2012-08-08 | Disposition: A | Discharge status: Home / Self Care | Payer: Self-pay | Attending: Emergency Medicine | Admitting: Emergency Medicine

## 2012-08-08 ENCOUNTER — Encounter (HOSPITAL_COMMUNITY): Payer: Self-pay | Admitting: Emergency Medicine

## 2012-08-08 DIAGNOSIS — F411 Generalized anxiety disorder: Secondary | ICD-10-CM | POA: Insufficient documentation

## 2012-08-08 DIAGNOSIS — Z79899 Other long term (current) drug therapy: Secondary | ICD-10-CM | POA: Insufficient documentation

## 2012-08-08 DIAGNOSIS — G43909 Migraine, unspecified, not intractable, without status migrainosus: Secondary | ICD-10-CM | POA: Insufficient documentation

## 2012-08-08 DIAGNOSIS — F329 Major depressive disorder, single episode, unspecified: Secondary | ICD-10-CM | POA: Insufficient documentation

## 2012-08-08 DIAGNOSIS — F3289 Other specified depressive episodes: Secondary | ICD-10-CM | POA: Insufficient documentation

## 2012-08-08 MED ORDER — KETOROLAC TROMETHAMINE 30 MG/ML IJ SOLN
30.0000 mg | Freq: Once | INTRAMUSCULAR | Status: AC
  Administered 2012-08-08: 30 mg via INTRAVENOUS
  Filled 2012-08-08: qty 1

## 2012-08-08 MED ORDER — SODIUM CHLORIDE 0.9 % IV BOLUS (SEPSIS)
1000.0000 mL | Freq: Once | INTRAVENOUS | Status: AC
  Administered 2012-08-08: 1000 mL via INTRAVENOUS

## 2012-08-08 MED ORDER — MORPHINE SULFATE 4 MG/ML IJ SOLN
6.0000 mg | Freq: Once | INTRAMUSCULAR | Status: AC
  Administered 2012-08-08: 6 mg via INTRAVENOUS
  Filled 2012-08-08: qty 2

## 2012-08-08 MED ORDER — METOCLOPRAMIDE HCL 5 MG/ML IJ SOLN
10.0000 mg | Freq: Once | INTRAMUSCULAR | Status: AC
  Administered 2012-08-08: 10 mg via INTRAVENOUS
  Filled 2012-08-08: qty 2

## 2012-08-08 MED ORDER — PROMETHAZINE HCL 25 MG PO TABS: 25.0000 mg | ORAL_TABLET | Freq: Four times a day (QID) | ORAL | Status: DC | PRN

## 2012-08-08 NOTE — ED Provider Notes (Signed)
Medical screening examination/treatment/procedure(s) were performed by non-physician practitioner and as supervising physician I was immediately available for consultation/collaboration.   Paz Winsett J. Errik Mitchelle, MD 08/08/12 0657 

## 2012-08-08 NOTE — ED Notes (Signed)
MD at bedside. 

## 2012-08-08 NOTE — ED Notes (Signed)
AOZ:[id number]<VH> Expected date:<BR> Expected time:<BR> Means of arrival:<BR> Comments:<BR> For Terminal Clean

## 2012-08-08 NOTE — ED Notes (Signed)
Pt states that her pain went from a 10 to a 9. States the medicine she received over an hour ago is yet to work in relieving her pain or headache. Also states that she is yet to see a doctor.

## 2012-08-08 NOTE — ED Notes (Signed)
Patient states it has been 45 minutes and she has had no relief-informed patient MD is aware

## 2012-08-08 NOTE — ED Notes (Signed)
IV RN called for placement

## 2012-08-08 NOTE — ED Notes (Signed)
States that she was seen here for a migraine on yesterday that orginated on Tuesday. States that she usually gets 3 shots and was not given that on yesterday and still has no relief

## 2012-08-08 NOTE — ED Provider Notes (Signed)
History     CSN: [id number]  Arrival date & time 08/08/12  1707   First MD Initiated Contact with Patient 08/08/12 1741      Chief Complaint  Patient presents with  . Migraine    The history is provided by the patient and medical records.   the patient reports a long-standing history of recurrent headaches.  She's been seen in emergency apartment 27 times the past 6 months.  She reports this is likely her typical headache that is frontal and left-sided.  She states she was seen emergency room yesterday and given several shots of pain medicine without resolution of symptoms and now she is back today.  She does not have insurance.  She's had a headache specialist who treated her with Lamictal.  She states she does not have the money her finances to see anyone else which is why she continues to come to the emergency department.  No nausea or vomiting.  She's tried [employer] today without any improvement in her symptoms.  She's been able to keep down fluids today.  She denies melena hematochezia.  No chest pain shortness breath.  No abdominal pain.  No recent falls or trauma.  No weakness of her upper lower extremities.  No difficulty with her speech.  Past Medical History  Diagnosis Date  . Depression   . Migraine   . Anxiety   . Drug-seeking behavior     Past Surgical History  Procedure Date  . Dilation and curettage of uterus   . Esophagogastroduodenoscopy 05/15/2012    Procedure: ESOPHAGOGASTRODUODENOSCOPY (EGD);  Surgeon: Beverley Fiedler, MD;  Location: [facility];  Service: Gastroenterology;  Laterality: N/A;    Family History  Problem Relation Age of Onset  . Hypertension Father   . Cancer Mother   . Colon cancer Neg Hx   . Stomach cancer Neg Hx     History  Substance Use Topics  . Smoking status: Never Smoker   . Smokeless tobacco: Never Used  . Alcohol Use: Yes     Comment: rarely    OB History    Grav Para Term Preterm Abortions TAB SAB Ect Mult Living   0                Review of Systems  All other systems reviewed and are negative.    Allergies  Benadryl and Compazine  Home Medications   Current Outpatient Rx  Name  Route  Sig  Dispense  Refill  . GOODY HEADACHE PO   Oral   Take 1 packet by mouth every 4 (four) hours as needed. Migraine.         Marland Kitchen PANTOPRAZOLE SODIUM 40 MG PO TBEC   Oral   Take 40 mg by mouth daily.          . SERTRALINE HCL 50 MG PO TABS   Oral   Take 1 tablet (50 mg total) by mouth daily.   15 tablet   0     BP 152/79  Pulse 104  Temp 98.7 F (37.1 C) (Oral)  Resp 20  SpO2 100%  LMP 07/15/2012  Physical Exam  Nursing note and vitals reviewed. Constitutional: She is oriented to person, place, and time. She appears well-developed and well-nourished. No distress.  HENT:  Head: Normocephalic and atraumatic.  Eyes: EOM are normal. Pupils are equal, round, and reactive to light.  Neck: Normal range of motion.  Cardiovascular: Normal rate, regular rhythm and normal heart sounds.  Pulmonary/Chest: Effort normal and breath sounds normal.  Abdominal: Soft. She exhibits no distension. There is no tenderness.  Musculoskeletal: Normal range of motion.  Neurological: She is alert and oriented to person, place, and time.       5/5 strength in major muscle groups of  bilateral upper and lower extremities. Speech normal. No facial asymetry.   Skin: Skin is warm and dry.  Psychiatric: She has a normal mood and affect. Judgment normal.    ED Course  Procedures (including critical care time)  Labs Reviewed - No data to display No results found.   1. Migraine       MDM  Typical migraine headache for the pt. Non focal neuro exam. No recent head trauma. No fever. Doubt meningitis. Doubt intracranial bleed. Doubt normal pressure hydrocephalus. No indication for imaging. Will treat with migraine cocktail and reevaluate  No significant improvement in her pain.  She is requesting Stadol, Phenergan,  Toradol.  I informed the patient and they'll be unable to give her these.  She received Toradol.  Discharge home.  She needs followup with a neurologist for her recurrent headache.        Lyanne Co, MD 08/08/12 2018

## 2012-08-31 ENCOUNTER — Other Ambulatory Visit: Payer: Self-pay

## 2012-09-09 ENCOUNTER — Telehealth: Payer: Self-pay | Admitting: Internal Medicine

## 2012-09-09 NOTE — Telephone Encounter (Signed)
Yes, EGD with balloon dilation She should be on daily PPI. No NSAIDs or Goody's powder for at least 7 days before procedure. Thanks

## 2012-09-09 NOTE — Telephone Encounter (Signed)
Patient reports that she is having a return of her dysphagia.  Do you want to proceed with another dilation?

## 2012-09-09 NOTE — Telephone Encounter (Signed)
Patient notified of Dr. Lauro Franklin recommendations.  She is scheduled for EGD with balloon dil on 09/18/12 4:00, she will come for a pre-visit 10/09/12 4:00

## 2012-09-11 ENCOUNTER — Telehealth: Payer: Self-pay | Admitting: Internal Medicine

## 2012-09-11 NOTE — Telephone Encounter (Signed)
Pt hurt her back and r/s PV appt and wanted an AM appt so her EGD was moved to 09/16/12.

## 2012-09-12 ENCOUNTER — Ambulatory Visit (AMBULATORY_SURGERY_CENTER): Payer: Self-pay

## 2012-09-12 VITALS — Ht 66.0 in | Wt 225.0 lb

## 2012-09-12 DIAGNOSIS — R1314 Dysphagia, pharyngoesophageal phase: Secondary | ICD-10-CM

## 2012-09-16 ENCOUNTER — Ambulatory Visit (AMBULATORY_SURGERY_CENTER): Payer: Self-pay | Admitting: Internal Medicine

## 2012-09-16 ENCOUNTER — Encounter: Payer: Self-pay | Admitting: Internal Medicine

## 2012-09-16 VITALS — BP 139/74 | HR 88 | Temp 99.3°F | Resp 25

## 2012-09-16 DIAGNOSIS — K222 Esophageal obstruction: Secondary | ICD-10-CM

## 2012-09-16 DIAGNOSIS — R1314 Dysphagia, pharyngoesophageal phase: Secondary | ICD-10-CM

## 2012-09-16 DIAGNOSIS — Q393 Congenital stenosis and stricture of esophagus: Secondary | ICD-10-CM

## 2012-09-16 DIAGNOSIS — K315 Obstruction of duodenum: Secondary | ICD-10-CM

## 2012-09-16 DIAGNOSIS — K2980 Duodenitis without bleeding: Secondary | ICD-10-CM

## 2012-09-16 DIAGNOSIS — Q391 Atresia of esophagus with tracheo-esophageal fistula: Secondary | ICD-10-CM

## 2012-09-16 DIAGNOSIS — R1310 Dysphagia, unspecified: Secondary | ICD-10-CM

## 2012-09-16 DIAGNOSIS — K269 Duodenal ulcer, unspecified as acute or chronic, without hemorrhage or perforation: Secondary | ICD-10-CM

## 2012-09-16 MED ORDER — PANTOPRAZOLE SODIUM 40 MG PO TBEC: DELAYED_RELEASE_TABLET | ORAL | Status: DC

## 2012-09-16 MED ORDER — SODIUM CHLORIDE 0.9 % IV SOLN: 500.0000 mL | INTRAVENOUS | Status: DC

## 2012-09-16 NOTE — Progress Notes (Signed)
Patient did not experience any of the following events: a burn prior to discharge; a fall within the facility; wrong site/side/patient/procedure/implant event; or a hospital transfer or hospital admission upon discharge from the facility. (G8907) Patient did not have preoperative order for IV antibiotic SSI prophylaxis. (G8918)  

## 2012-09-16 NOTE — Patient Instructions (Signed)
Dilation Diet today, see handouts. Refills for Pantoprazole sent to your pharmacy. Repeat EGD with dilation in 4 weeks. Call us with any questions or concerns. Thank you!!  YOU HAD AN ENDOSCOPIC PROCEDURE TODAY AT THE [facility]: Refer to the procedure report that was given to you for any specific questions about what was found during the examination.  If the procedure report does not answer your questions, please call your gastroenterologist to clarify.  If you requested that your care partner not be given the details of your procedure findings, then the procedure report has been included in a sealed envelope for you to review at your convenience later.  YOU SHOULD EXPECT: Some feelings of bloating in the abdomen. Passage of more gas than usual.  Walking can help get rid of the air that was put into your GI tract during the procedure and reduce the bloating. If you had a lower endoscopy (such as a colonoscopy or flexible sigmoidoscopy) you may notice spotting of blood in your stool or on the toilet paper. If you underwent a bowel prep for your procedure, then you may not have a normal bowel movement for a few days.  DIET: Clear liquids for 1 hour, then soft foods rest of today, resume prior diet tomorrow.  Drink plenty of fluids but you should avoid alcoholic beverages for 24 hours.  ACTIVITY: Your care partner should take you home directly after the procedure.  You should plan to take it easy, moving slowly for the rest of the day.  You can resume normal activity the day after the procedure however you should NOT DRIVE or use heavy machinery for 24 hours (because of the sedation medicines used during the test).    SYMPTOMS TO REPORT IMMEDIATELY: A gastroenterologist can be reached at any hour.  During normal business hours, 8:30 AM to 5:00 PM Monday through Friday, call [phone].  After hours and on weekends, please call the GI answering service at [phone] who will take a  message and have the physician on call contact you.   Following lower endoscopy (colonoscopy or flexible sigmoidoscopy):  Excessive amounts of blood in the stool  Significant tenderness or worsening of abdominal pains  Swelling of the abdomen that is new, acute  Fever of 100F or higher  Following upper endoscopy (EGD)  Vomiting of blood or coffee ground material  New chest pain or pain under the shoulder blades  Painful or persistently difficult swallowing  New shortness of breath  Fever of 100F or higher  Black, tarry-looking stools  FOLLOW UP: If any biopsies were taken you will be contacted by phone or by letter within the next 1-3 weeks.  Call your gastroenterologist if you have not heard about the biopsies in 3 weeks.  Our staff will call the home number listed on your records the next business day following your procedure to check on you and address any questions or concerns that you may have at that time regarding the information given to you following your procedure. This is a courtesy call and so if there is no answer at the home number and we have not heard from you through the emergency physician on call, we will assume that you have returned to your regular daily activities without incident.  SIGNATURES/CONFIDENTIALITY: You and/or your care partner have signed paperwork which will be entered into your electronic medical record.  These signatures attest to the fact that that the information above on your After Visit Summary has  been reviewed and is understood.  Full responsibility of the confidentiality of this discharge information lies with you and/or your care-partner.

## 2012-09-16 NOTE — Progress Notes (Signed)
Called to room to assist during endoscopic procedure.  Patient ID and intended procedure confirmed with present staff. Received instructions for my participation in the procedure from the performing physician.  

## 2012-09-16 NOTE — Op Note (Signed)
[facility] [address] [place], [address]   ENDOSCOPY PROCEDURE REPORT  PATIENT: Maureen, Chen  MR#: [id number] BIRTHDATE: 19-Jul-1965 , 47  yrs. old GENDER: Female ENDOSCOPIST: Beverley Fiedler, MD ASSISTANT:   Morrell Riddle, RN REFERRED BY: PROCEDURE DATE:  09/16/2012 PROCEDURE:   EGD with biopsy and EGD with balloon dilatation ASA CLASS:   Class II INDICATIONS:dysphagia and follow-up of stricture. MEDICATIONS: MAC sedation, administered by CRNA and propofol (Diprivan) 350mg  IV TOPICAL ANESTHETIC:   Cetacaine Spray  DESCRIPTION OF PROCEDURE:   After the risks benefits and alternatives of the procedure were thoroughly explained, informed consent was obtained.  The [id number]  endoscope was introduced through the mouth  and advanced to the bulb of duodenum ,      The instrument was slowly withdrawn as the mucosa was carefully examined.    ESOPHAGUS: A moderately severe Schatzki ring was found at the gastroesophageal junction; there was mild resistance with passage of the standard adult upper endoscope.   A 4 cm hiatal hernia was noted.  STOMACH: The mucosa of the stomach appeared normal.  DUODENUM: Mild to moderate duodenal inflammation was found in the duodenal bulb at the junction with D2.   Non-traversable acquired stenosis, as previously seen, was found at the junction of D1 and D2.  This stenosis was unable to be traversed by the adult upper endoscope.  Multiple biopsies were performed using cold forceps.   Dilation was then performed at the gastroesophageal junction  Dilator:Balloon Size:12 and 13.5 mm Heme:yes Appearance:satisfactory  COMPLICATIONS: There were no complications.  ENDOSCOPIC IMPRESSION: 1.   Schatzki ring was found at the gastroesophageal junction; dilation to 13.5 mm 2.   4 cm hiatal hernia 3.   The mucosa of the stomach appeared normal 4.   Duodenal inflammation was found in the duodenal bulb 5.   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  Await pathology results 2.  Continue taking your PPI (pantoprazole) once daily.  It is best to be taken 20-30 minutes prior to breakfast meal.  Refill pantoprazole 40 mg. 3.  Can repeat dilation in about 4 weeks   eSigned:  Beverley Fiedler, MD 09/16/2012 11:58 AM   CC:The Patient; Ilene Qua, MD  PATIENT NAME:  Maureen, Chen MR#: [id number]

## 2012-09-16 NOTE — Progress Notes (Signed)
Patient states she was in [facility] Saturday, had migraine went to ED and got shot of stadol,toradol and phenergan. Also took Stadol nasal spray this morning for "headache". Note placed on front of patient's chart. Sherren Kerns

## 2012-09-17 ENCOUNTER — Telehealth: Payer: Self-pay

## 2012-09-17 NOTE — Telephone Encounter (Signed)
  Follow up Call-  Call back number 09/16/2012 07/18/2012  Post procedure Call Back phone  # [phone]  Permission to leave phone message Yes Yes     Patient questions:  Do you have a fever, pain , or abdominal swelling? no Pain Score  0 *  Have you tolerated food without any problems? yes  Have you been able to return to your normal activities? yes  Do you have any questions about your discharge instructions: Diet   no Medications  no Follow up visit  no  Do you have questions or concerns about your Care? no  Actions: * If pain score is 4 or above: No action needed, pain <4.

## 2012-09-18 ENCOUNTER — Encounter: Payer: Self-pay | Admitting: Internal Medicine

## 2012-09-20 ENCOUNTER — Encounter: Payer: Self-pay | Admitting: Internal Medicine

## 2012-10-08 ENCOUNTER — Ambulatory Visit (AMBULATORY_SURGERY_CENTER): Payer: Self-pay

## 2012-10-08 VITALS — Ht 67.0 in | Wt 221.2 lb

## 2012-10-08 DIAGNOSIS — R05 Cough: Secondary | ICD-10-CM

## 2012-10-08 DIAGNOSIS — R1319 Other dysphagia: Secondary | ICD-10-CM

## 2012-10-08 DIAGNOSIS — R1310 Dysphagia, unspecified: Secondary | ICD-10-CM

## 2012-10-08 NOTE — Progress Notes (Signed)
Pt came into the office today for her pre-visit prior to her endoscopy with Dr Rhea Belton on 10/17/12. Appointment for her repeat endoscopy/dil  was moved ahead to 10/09/12 per patient's request due to coughing and painful swallowing.Appointment for 10/17/12 was cancelled.

## 2012-10-09 ENCOUNTER — Other Ambulatory Visit: Payer: Self-pay | Admitting: Internal Medicine

## 2012-10-09 ENCOUNTER — Telehealth: Payer: Self-pay

## 2012-10-09 ENCOUNTER — Ambulatory Visit (AMBULATORY_SURGERY_CENTER): Payer: Self-pay | Admitting: Internal Medicine

## 2012-10-09 ENCOUNTER — Encounter: Payer: Self-pay | Admitting: Internal Medicine

## 2012-10-09 VITALS — BP 128/75 | HR 80 | Temp 98.7°F | Resp 24 | Ht 67.0 in | Wt 221.0 lb

## 2012-10-09 DIAGNOSIS — R1319 Other dysphagia: Secondary | ICD-10-CM

## 2012-10-09 DIAGNOSIS — K3189 Other diseases of stomach and duodenum: Secondary | ICD-10-CM

## 2012-10-09 DIAGNOSIS — K222 Esophageal obstruction: Secondary | ICD-10-CM

## 2012-10-09 DIAGNOSIS — R05 Cough: Secondary | ICD-10-CM

## 2012-10-09 DIAGNOSIS — K210 Gastro-esophageal reflux disease with esophagitis: Secondary | ICD-10-CM

## 2012-10-09 DIAGNOSIS — R1013 Epigastric pain: Secondary | ICD-10-CM

## 2012-10-09 DIAGNOSIS — K315 Obstruction of duodenum: Secondary | ICD-10-CM

## 2012-10-09 DIAGNOSIS — K269 Duodenal ulcer, unspecified as acute or chronic, without hemorrhage or perforation: Secondary | ICD-10-CM

## 2012-10-09 MED ORDER — SODIUM CHLORIDE 0.9 % IV SOLN: 500.0000 mL | INTRAVENOUS | Status: DC

## 2012-10-09 NOTE — Progress Notes (Signed)
Patient did not experience any of the following events: a burn prior to discharge; a fall within the facility; wrong site/side/patient/procedure/implant event; or a hospital transfer or hospital admission upon discharge from the facility. (G8907) Patient did not have preoperative order for IV antibiotic SSI prophylaxis. (G8918)  

## 2012-10-09 NOTE — Op Note (Signed)
[facility] [address] [place], [address]   ENDOSCOPY PROCEDURE REPORT  PATIENT: Maureen Chen, Maureen Chen  MR#: [id number] BIRTHDATE: March 30, 1966 , 47  yrs. old GENDER: Female ENDOSCOPIST: Beverley Fiedler, MD ASSISTANT:   Weston Brass, RN PROCEDURE DATE:  10/09/2012 PROCEDURE:   EGD with balloon dilatation and EGD with biopsy ASA CLASS:   Class II INDICATIONS:dysphagia. MEDICATIONS: MAC sedation, administered by CRNA and propofol (Diprivan) 450mg  IV TOPICAL ANESTHETIC:   Cetacaine Spray  DESCRIPTION OF PROCEDURE:   After the risks benefits and alternatives of the procedure were thoroughly explained, informed consent was obtained.  The     endoscope was introduced through the mouth  and advanced to the bulb of duodenum ,      The instrument was slowly withdrawn as the mucosa was carefully examined.   ESOPHAGUS: The esophagus was tortuous in the distal third.   A stricture was found 34 cm from the incisors (previously seen and biopsied).  The stenosis was traversable with the endoscope.   The mucosa of the esophagus appeared normal.  Multiple biopsies were taken in the distal and mid esophagus to rule out eosinophilic esophagitis.   A 4 cm hiatal hernia was noted.  STOMACH: The mucosa of the stomach appeared normal.  DUODENUM: Severe duodenal inflammation with erythema and ulcerations was found in the distal duodenal bulb.   Non-traversable and severe acquired stenosis was found in the distal duodenal bulb (recently biopsied)  Dilation was then performed at the gastroesophageal junction  Dilator:Balloon Size:13.5 mm and 15 mm Heme:yes Appearance:satisfactory  COMPLICATIONS: There were no complications.  ENDOSCOPIC IMPRESSION: 1.   The esophagus was tortuous 2.   Stricture was found 34 cm from the incisors 3.   The mucosa of the esophagus appeared normal; multiple biopsies were taken in the distal and mid esophagus to rule out eosinophilic esophagitis 4.   4  cm hiatal hernia 5.   The mucosa of the stomach appeared normal 6.   Duodenal inflammation was found in the duodenal bulb 7.   Acquired stenosis was found in the duodenal bulb  RECOMMENDATIONS: 1.  Await pathology results 2.  Liquid diet the remainder of today; soft diet tomorrow 3.  Absolutely no NSAIDs 4.  Continue pantoprazole 40 mg twice daily 5.  Barium swallow with upper GI series in about 2 weeks to evaluate duodenal stenosis 6.  Office follow-up after barium study   eSigned:  Beverley Fiedler, MD 10/09/2012 10:44 AM   CC:The Patient and Rodman Pickle, MD  PATIENT NAME:  Anahy, Esh MR#: [id number]

## 2012-10-09 NOTE — Patient Instructions (Addendum)
Discharge instructions given with verbal understanding. Handouts on a high hernia, and a dilatation diet given. Liquid diet the remainder of the day. Soft diet tomorrow. Absolutely no NSAIDS. Barium Swallow with upper GI series in 2 weeks. Office will schedule. Office follow up after barium study. YOU HAD AN ENDOSCOPIC PROCEDURE TODAY AT THE [facility]: Refer to the procedure report that was given to you for any specific questions about what was found during the examination.  If the procedure report does not answer your questions, please call your gastroenterologist to clarify.  If you requested that your care partner not be given the details of your procedure findings, then the procedure report has been included in a sealed envelope for you to review at your convenience later.  YOU SHOULD EXPECT: Some feelings of bloating in the abdomen. Passage of more gas than usual.  Walking can help get rid of the air that was put into your GI tract during the procedure and reduce the bloating. If you had a lower endoscopy (such as a colonoscopy or flexible sigmoidoscopy) you may notice spotting of blood in your stool or on the toilet paper. If you underwent a bowel prep for your procedure, then you may not have a normal bowel movement for a few days.  DIET: Your first meal following the procedure should be a light meal and then it is ok to progress to your normal diet.  A half-sandwich or bowl of soup is an example of a good first meal.  Heavy or fried foods are harder to digest and may make you feel nauseous or bloated.  Likewise meals heavy in dairy and vegetables can cause extra gas to form and this can also increase the bloating.  Drink plenty of fluids but you should avoid alcoholic beverages for 24 hours.  ACTIVITY: Your care partner should take you home directly after the procedure.  You should plan to take it easy, moving slowly for the rest of the day.  You can resume normal activity the day  after the procedure however you should NOT DRIVE or use heavy machinery for 24 hours (because of the sedation medicines used during the test).    SYMPTOMS TO REPORT IMMEDIATELY: A gastroenterologist can be reached at any hour.  During normal business hours, 8:30 AM to 5:00 PM Monday through Friday, call [phone].  After hours and on weekends, please call the GI answering service at [phone] who will take a message and have the physician on call contact you.   Following upper endoscopy (EGD)  Vomiting of blood or coffee ground material  New chest pain or pain under the shoulder blades  Painful or persistently difficult swallowing  New shortness of breath  Fever of 100F or higher  Black, tarry-looking stools  FOLLOW UP: If any biopsies were taken you will be contacted by phone or by letter within the next 1-3 weeks.  Call your gastroenterologist if you have not heard about the biopsies in 3 weeks.  Our staff will call the home number listed on your records the next business day following your procedure to check on you and address any questions or concerns that you may have at that time regarding the information given to you following your procedure. This is a courtesy call and so if there is no answer at the home number and we have not heard from you through the emergency physician on call, we will assume that you have returned to your regular daily activities  without incident.  SIGNATURES/CONFIDENTIALITY: You and/or your care partner have signed paperwork which will be entered into your electronic medical record.  These signatures attest to the fact that that the information above on your After Visit Summary has been reviewed and is understood.  Full responsibility of the confidentiality of this discharge information lies with you and/or your care-partner.

## 2012-10-09 NOTE — Telephone Encounter (Signed)
Pt scheduled for barium swallow and an upper GI series at [facility] 10/20/12. Pt to arrive at 9:15am for a 9:30am appt. Pt to be NPO after midnight. Pt scheduled for OV with Dr. Rhea Belton 11/04/12@9am . Celia RN to notify pt of appt dates and times.

## 2012-10-09 NOTE — Progress Notes (Signed)
Called to room to assist during endoscopic procedure.  Patient ID and intended procedure confirmed with present staff. Received instructions for my participation in the procedure from the performing physician.  

## 2012-10-10 ENCOUNTER — Telehealth: Payer: Self-pay | Admitting: Internal Medicine

## 2012-10-10 ENCOUNTER — Telehealth: Payer: Self-pay | Admitting: *Deleted

## 2012-10-10 NOTE — Telephone Encounter (Signed)
  Spoke with patient, and she was very concerned that she couldn't take her Goodies Powders for migraines.  We did find out that she had refills on her toradol and phenergan IM.  She stated that she will refill those medications.   She will be seeing Dr. Rhea Belton in the office in one month.  She will speak with him about the Goodies at that time.  Patient will call us if she has any problems with pain control.

## 2012-10-10 NOTE — Telephone Encounter (Signed)
  Follow up Call-  Call back number 10/09/2012 09/16/2012 07/18/2012  Post procedure Call Back phone  # [phone]  Permission to leave phone message Yes Yes Yes     Patient questions:  Do you have a fever, pain , or abdominal swelling? no Pain Score  0 *  Have you tolerated food without any problems? yes  Have you been able to return to your normal activities? yes  Do you have any questions about your discharge instructions: Diet   no Medications  no Follow up visit  no  Do you have questions or concerns about your Care? no  Actions: * If pain score is 4 or above: No action needed, pain <4.

## 2012-10-15 ENCOUNTER — Encounter (HOSPITAL_COMMUNITY): Payer: Self-pay

## 2012-10-15 ENCOUNTER — Emergency Department (HOSPITAL_COMMUNITY)
Admission: EM | Admit: 2012-10-15 | Discharge: 2012-10-15 | Disposition: A | Discharge status: Home / Self Care | Payer: Self-pay | Attending: Emergency Medicine | Admitting: Emergency Medicine

## 2012-10-15 ENCOUNTER — Encounter: Payer: Self-pay | Admitting: Internal Medicine

## 2012-10-15 DIAGNOSIS — G43909 Migraine, unspecified, not intractable, without status migrainosus: Secondary | ICD-10-CM | POA: Insufficient documentation

## 2012-10-15 DIAGNOSIS — Z8719 Personal history of other diseases of the digestive system: Secondary | ICD-10-CM | POA: Insufficient documentation

## 2012-10-15 DIAGNOSIS — F329 Major depressive disorder, single episode, unspecified: Secondary | ICD-10-CM | POA: Insufficient documentation

## 2012-10-15 DIAGNOSIS — Z79899 Other long term (current) drug therapy: Secondary | ICD-10-CM | POA: Insufficient documentation

## 2012-10-15 DIAGNOSIS — R110 Nausea: Secondary | ICD-10-CM | POA: Insufficient documentation

## 2012-10-15 DIAGNOSIS — F411 Generalized anxiety disorder: Secondary | ICD-10-CM | POA: Insufficient documentation

## 2012-10-15 DIAGNOSIS — F3289 Other specified depressive episodes: Secondary | ICD-10-CM | POA: Insufficient documentation

## 2012-10-15 MED ORDER — METOCLOPRAMIDE HCL 5 MG/ML IJ SOLN
10.0000 mg | Freq: Once | INTRAMUSCULAR | Status: AC
  Administered 2012-10-15: 10 mg via INTRAMUSCULAR
  Filled 2012-10-15: qty 2

## 2012-10-15 MED ORDER — DEXAMETHASONE SODIUM PHOSPHATE 10 MG/ML IJ SOLN
10.0000 mg | Freq: Once | INTRAMUSCULAR | Status: AC
  Administered 2012-10-15: 10 mg via INTRAMUSCULAR
  Filled 2012-10-15: qty 1

## 2012-10-15 MED ORDER — KETOROLAC TROMETHAMINE 30 MG/ML IJ SOLN
30.0000 mg | Freq: Once | INTRAMUSCULAR | Status: AC
  Administered 2012-10-15: 30 mg via INTRAMUSCULAR
  Filled 2012-10-15: qty 1

## 2012-10-15 NOTE — ED Provider Notes (Signed)
History    This chart was scribed for non-physician practitioner working with Toy Baker, MD by ED Scribe, Burman Nieves. This patient was seen in room WTR5/WTR5 and the patient's care was started at 5:00 PM.   CSN: [id number]  Arrival date & time 10/15/12  1615   First MD Initiated Contact with Patient 10/15/12 1700      Chief Complaint  Patient presents with  . Migraine    (Consider location/radiation/quality/duration/timing/severity/associated sxs/prior treatment) Patient is a 47 y.o. female presenting with migraines. The history is provided by the patient. No language interpreter was used.  Migraine This is a new problem. The current episode started more than 2 days ago. The problem occurs constantly. The problem has not changed since onset.Associated symptoms include headaches. Exacerbated by: light and sound. Nothing relieves the symptoms. She has tried acetaminophen for the symptoms. The treatment provided no relief.  Migraine This is a new problem. The current episode started more than 2 days ago. The problem occurs constantly. The problem has not changed since onset.Associated symptoms include headaches. Exacerbated by: light and sound. She has tried acetaminophen for the symptoms. The treatment provided no relief.   Maureen Chen is a 47 y.o. female with h/o drug seeking behavior, depression, anxiety, and migraines who presents to the Emergency Department complaining of moderate constant pain due to a migraine onset since Saturday afternoon. Pt states she has mild associated nausea with the migraine. She says the pain is located in her temples bilaterally and behind both eyes. No head trauma or LOC. Light and sound seem to exacerbate the pain. Pt has been taking OTC tylenol migraine medication with no immediate relief. Pt is prescribed Lamictal for the migraines which has not seemed to help either. Pt denies any visual disturbance, fever, chills, cough, nausea, vomiting,  diarrhea, SOB, weakness, and any other associated symptoms.  Pt sees neurology regularly.   Past Medical History  Diagnosis Date  . Depression   . Migraine   . Anxiety   . Drug-seeking behavior   . Dysphagia     Past Surgical History  Procedure Laterality Date  . Dilation and curettage of uterus    . Esophagogastroduodenoscopy  05/15/2012    Procedure: ESOPHAGOGASTRODUODENOSCOPY (EGD);  Surgeon: Beverley Fiedler, MD;  Location: [facility];  Service: Gastroenterology;  Laterality: N/A;  . Upper gastrointestinal endoscopy      Family History  Problem Relation Age of Onset  . Hypertension Father   . Cancer Mother   . Breast cancer Mother   . Colon cancer Neg Hx   . Stomach cancer Neg Hx     History  Substance Use Topics  . Smoking status: Never Smoker   . Smokeless tobacco: Never Used  . Alcohol Use: No     Comment: rarely    OB History   Grav Para Term Preterm Abortions TAB SAB Ect Mult Living   0               Review of Systems  Neurological: Positive for headaches.  All other systems reviewed and are negative.    Allergies  Benadryl and Compazine  Home Medications   Current Outpatient Rx  Name  Route  Sig  Dispense  Refill  . lamoTRIgine (LAMICTAL) 100 MG tablet   Oral   Take 100 mg by mouth daily.         . pantoprazole (PROTONIX) 40 MG tablet      Take 20-30 minutes prior to breakfast meal  30 tablet   5     LMP 09/26/2012  Physical Exam  Nursing note and vitals reviewed. Constitutional: She is oriented to person, place, and time. She appears well-developed and well-nourished. No distress.  HENT:  Head: Normocephalic and atraumatic.  Eyes: Conjunctivae and EOM are normal. Pupils are equal, round, and reactive to light.  Neck: Normal range of motion. Neck supple. No muscular tenderness present. No rigidity. No tracheal deviation present.  Cardiovascular: Normal rate, regular rhythm and normal heart sounds.   No murmur  heard. Pulmonary/Chest: Effort normal and breath sounds normal. No respiratory distress. She has no rales.  Musculoskeletal: Normal range of motion.  Lymphadenopathy:    She has no cervical adenopathy.  Neurological: She is alert and oriented to person, place, and time. She has normal strength. No cranial nerve deficit or sensory deficit.  Skin: Skin is warm and dry.  Psychiatric: She has a normal mood and affect. Her behavior is normal.    ED Course  Procedures (including critical care time) DIAGNOSTIC STUDIES: Oxygen Saturation is 100% on room air, normal by my interpretation.    COORDINATION OF CARE: 5:12 PM Discussed ED treatment with pt and pt agrees.     Labs Reviewed - No data to display No results found.   1. Migraine       MDM   Pt seen in the ED multiple times for migraines.  Pt established with PCP and neurologist who prescribes her lamictal for migraines.  Pt states she has been back on the lamictal for approx 1 month.  Pt made several requests for specific medications (stadalol, phenergan, toradol).  Pt was given toradol, reglan, and decadron IM.  Pt continues to request additional pain medications.  I have a high suspicion for drug seeking behavior and declined further pain medications in the ED or Rx narcotics.  FU with PCP and neurologist for further management of migraines.  Return precautions advised.   I personally performed the services described in this documentation, which was scribed in my presence. The recorded information has been reviewed and is accurate.        Garlon Hatchet, PA-C 10/15/12 [phone]

## 2012-10-15 NOTE — ED Notes (Signed)
Per patient, she has had a migraine since Saturday afternoon. Pt states she has taken tylenol and OTC migraine medication with no relief. Per patient, she has a little nausea with the migraine. Sensitive to light and sound.

## 2012-10-16 NOTE — ED Provider Notes (Signed)
Medical screening examination/treatment/procedure(s) were performed by non-physician practitioner and as supervising physician I was immediately available for consultation/collaboration.  Sholonda Jobst T Everlene Cunning, MD 10/16/12 1708 

## 2012-10-17 ENCOUNTER — Encounter: Payer: Self-pay | Admitting: Internal Medicine

## 2012-10-22 ENCOUNTER — Ambulatory Visit (HOSPITAL_COMMUNITY)
Admission: RE | Admit: 2012-10-22 | Discharge: 2012-10-22 | Disposition: A | Discharge status: Home / Self Care | Payer: Self-pay | Source: Ambulatory Visit | Attending: Internal Medicine | Admitting: Internal Medicine

## 2012-10-22 DIAGNOSIS — K315 Obstruction of duodenum: Secondary | ICD-10-CM

## 2012-10-22 DIAGNOSIS — K449 Diaphragmatic hernia without obstruction or gangrene: Secondary | ICD-10-CM | POA: Insufficient documentation

## 2012-10-22 DIAGNOSIS — K222 Esophageal obstruction: Secondary | ICD-10-CM | POA: Insufficient documentation

## 2012-10-23 ENCOUNTER — Other Ambulatory Visit (HOSPITAL_COMMUNITY): Payer: Self-pay

## 2012-10-24 ENCOUNTER — Other Ambulatory Visit: Payer: Self-pay | Admitting: Internal Medicine

## 2012-10-25 ENCOUNTER — Other Ambulatory Visit: Payer: Self-pay | Admitting: Gastroenterology

## 2012-10-25 MED ORDER — PANTOPRAZOLE SODIUM 40 MG PO TBEC: 40.0000 mg | DELAYED_RELEASE_TABLET | Freq: Every day | ORAL | Status: DC

## 2012-10-25 MED ORDER — PANTOPRAZOLE SODIUM 40 MG PO TBEC: 40.0000 mg | DELAYED_RELEASE_TABLET | Freq: Two times a day (BID) | ORAL | Status: DC

## 2012-11-04 ENCOUNTER — Ambulatory Visit: Payer: Self-pay | Admitting: Internal Medicine

## 2013-01-06 NOTE — Telephone Encounter (Signed)
See other note for 10/10/12

## 2013-04-21 ENCOUNTER — Telehealth: Payer: Self-pay | Admitting: Internal Medicine

## 2013-04-21 NOTE — Telephone Encounter (Signed)
Pt's mom reports pt has been having problems with swallowing for the past week; almost everything she eats, she throws it back up. Pt has no insurance, but started a new job today and wants to know if we can schedule a Direct EGD with possible Dil next week? OK?  Please advise.

## 2013-04-22 NOTE — Telephone Encounter (Signed)
Spoke with pt's mom and she states pt has had 1 or 2 Goody's, but she has not been abusing NSAIDS. She went to a MD Saturday who gave her migraine meds like Klonipin, Trazadone and Fioricet so she will not need the NSAIDS hopefully. Mom will call be back with possible dates.

## 2013-04-22 NOTE — Telephone Encounter (Signed)
Spoke with pt who would like to have her EGD with possible dil this Friday, 04/25/13. She just started a new job and it's the only day she can get off. She reports she was in the hospital in [facility] Sept. 1-3 for another migraine. She took about 1.5 boxes of Goody's trying to get rid of it and had to be admitted. She was given fluids and IV protonix. Dr Reed Pandy did a "small egd and saw no ulcers and stretched her lower stomach, but not her esophagus". She wants Dr Rhea Belton to do an EGD Friday and I informed her he is off and she gave [place] permission for another doc here to do the procedure. Explained we don't normally have another docs do this unless it's an emergency. Her cell # [phone] Please advise. Thanks.

## 2013-04-22 NOTE — Telephone Encounter (Signed)
Pt has a hx of duodenal ulcer disease and NSAID abuse.   She also has hx of esophageal stricture with previously dilations. She should be on chronic daily PPI I am okay with direct upper endoscopy assuming she is not taking NSAIDs, aspirin, or other anticoagulant/blood thinning medication She will need to come for pre-visit for informed consent, etc.

## 2013-04-23 NOTE — Telephone Encounter (Signed)
Pt called back and states she lost her job, so she can come anytime for her EGD. She will have PV on 04/28/13 and her EGD on 05/02/13.

## 2013-04-23 NOTE — Telephone Encounter (Signed)
I am not in the office Friday and due to limited availability upper endoscopy will be not be possible this Friday She can be scheduled as previously stated

## 2013-04-23 NOTE — Telephone Encounter (Signed)
lmom for pt to call back; left her Dr Lauro Franklin comments. Asked her to call back and leave the name of the hospital so we may obtain her records.

## 2013-04-29 ENCOUNTER — Ambulatory Visit (AMBULATORY_SURGERY_CENTER): Payer: Self-pay

## 2013-04-29 VITALS — Ht 67.0 in | Wt 213.8 lb

## 2013-04-29 DIAGNOSIS — R1319 Other dysphagia: Secondary | ICD-10-CM

## 2013-04-29 NOTE — Progress Notes (Signed)
Pt came into the office today for her pre-visit prior to her endoscopy with dil with Dr Rhea Belton on 05/02/13. Pt states she had an endoscopy with dil with Dr Reed Pandy at [facility] in Sept 2014. Pt signed a medical release which will be given to [facility].Pt was informed she may need to reschedule her endoscopy since Dr Rhea Belton will need to review her records.

## 2013-05-02 ENCOUNTER — Ambulatory Visit (AMBULATORY_SURGERY_CENTER): Payer: 59 | Admitting: Internal Medicine

## 2013-05-02 ENCOUNTER — Telehealth: Payer: Self-pay | Admitting: Internal Medicine

## 2013-05-02 ENCOUNTER — Encounter: Payer: Self-pay | Admitting: Internal Medicine

## 2013-05-02 ENCOUNTER — Telehealth: Payer: Self-pay | Admitting: *Deleted

## 2013-05-02 VITALS — BP 127/78 | HR 77 | Temp 98.7°F | Resp 26 | Ht 67.0 in | Wt 213.0 lb

## 2013-05-02 DIAGNOSIS — K222 Esophageal obstruction: Secondary | ICD-10-CM

## 2013-05-02 DIAGNOSIS — R1319 Other dysphagia: Secondary | ICD-10-CM

## 2013-05-02 DIAGNOSIS — Q391 Atresia of esophagus with tracheo-esophageal fistula: Secondary | ICD-10-CM

## 2013-05-02 DIAGNOSIS — R1314 Dysphagia, pharyngoesophageal phase: Secondary | ICD-10-CM

## 2013-05-02 DIAGNOSIS — R1310 Dysphagia, unspecified: Secondary | ICD-10-CM

## 2013-05-02 MED ORDER — SODIUM CHLORIDE 0.9 % IV SOLN: 500.0000 mL | INTRAVENOUS | Status: DC

## 2013-05-02 NOTE — Telephone Encounter (Signed)
I spoke with the patient and she ate a big bowel of chicken stew, and a big bowel of ice cream.  After that she threw up.   I had told her to take it easy with the food.   Now she understands what to eat and how much.  She laughed and told me that she was "really stupid to do that."  But she "was very hungry."     She doesn't think that she will have any more problems.

## 2013-05-02 NOTE — Progress Notes (Signed)
Called to room to assist during endoscopic procedure.  Patient ID and intended procedure confirmed with present staff. Received instructions for my participation in the procedure from the performing physician.  

## 2013-05-02 NOTE — Patient Instructions (Signed)
YOU HAD AN ENDOSCOPIC PROCEDURE TODAY AT THE [facility]: Refer to the procedure report that was given to you for any specific questions about what was found during the examination.  If the procedure report does not answer your questions, please call your gastroenterologist to clarify.  If you requested that your care partner not be given the details of your procedure findings, then the procedure report has been included in a sealed envelope for you to review at your convenience later.  YOU SHOULD EXPECT: Some feelings of bloating in the abdomen. Passage of more gas than usual.  Walking can help get rid of the air that was put into your GI tract during the procedure and reduce the bloating. If you had a lower endoscopy (such as a colonoscopy or flexible sigmoidoscopy) you may notice spotting of blood in your stool or on the toilet paper. If you underwent a bowel prep for your procedure, then you may not have a normal bowel movement for a few days.  DIET:  Drink plenty of fluids but you should avoid alcoholic beverages for 24 hours.   You may have clear liquids until evening; if that agrees with you, you may proceed to a soft diet for the rest of today.  This is crucial so you do not choke.  Avoid NSAIDS.  Continue protonix.  ACTIVITY: Your care partner should take you home directly after the procedure.  You should plan to take it easy, moving slowly for the rest of the day.  You can resume normal activity the day after the procedure however you should NOT DRIVE or use heavy machinery for 24 hours (because of the sedation medicines used during the test).    SYMPTOMS TO REPORT IMMEDIATELY: A gastroenterologist can be reached at any hour.  During normal business hours, 8:30 AM to 5:00 PM Monday through Friday, call [phone].  After hours and on weekends, please call the GI answering service at [phone] who will take a message and have the physician on call contact you.   Following  upper endoscopy (EGD)  Vomiting of blood or coffee ground material  New chest pain or pain under the shoulder blades  Painful or persistently difficult swallowing  New shortness of breath  Fever of 100F or higher  Black, tarry-looking stools  FOLLOW UP: If any biopsies were taken you will be contacted by phone or by letter within the next 1-3 weeks.  Call your gastroenterologist if you have not heard about the biopsies in 3 weeks.  Our staff will call the home number listed on your records the next business day following your procedure to check on you and address any questions or concerns that you may have at that time regarding the information given to you following your procedure. This is a courtesy call and so if there is no answer at the home number and we have not heard from you through the emergency physician on call, we will assume that you have returned to your regular daily activities without incident.  SIGNATURES/CONFIDENTIALITY: You and/or your care partner have signed paperwork which will be entered into your electronic medical record.  These signatures attest to the fact that that the information above on your After Visit Summary has been reviewed and is understood.  Full responsibility of the confidentiality of this discharge information lies with you and/or your care-partner.

## 2013-05-02 NOTE — Progress Notes (Addendum)
Patient did not have preoperative order for IV antibiotic SSI prophylaxis. (G8918)  Patient did not experience any of the following events: a burn prior to discharge; a fall within the facility; wrong site/side/patient/procedure/implant event; or a hospital transfer or hospital admission upon discharge from the facility. (G8907)  

## 2013-05-02 NOTE — Op Note (Signed)
[facility] [address] [place], [address]   ENDOSCOPY PROCEDURE REPORT  PATIENT: Maureen Chen, Maureen Chen  MR#: [id number] BIRTHDATE: [birth year] , 47  yrs. old GENDER: Female ENDOSCOPIST: Beverley Fiedler, MD ASSISTANT:   Threasa Beards, RN PROCEDURE DATE:  05/02/2013 PROCEDURE:   EGD with balloon dilatation ASA CLASS:   Class II INDICATIONS:dysphagia.  history of Schatzki's ring MEDICATIONS: MAC sedation, administered by CRNA and Propofol (Diprivan) 230 mg IV TOPICAL ANESTHETIC:   none  DESCRIPTION OF PROCEDURE:   After the risks benefits and alternatives of the procedure were thoroughly explained, informed consent was obtained.  The LB [id number]  endoscope was introduced through the mouth  and advanced to the D1/D2 junction, without limitation.        The instrument was slowly withdrawn as the mucosa was carefully examined.  ESOPHAGUS: A Schatzki ring was found at the gastroesophageal junction and 35 cm from the incisors.   A 4 cm hiatal hernia was noted.  STOMACH: The mucosa of the stomach appeared normal.  DUODENUM: A benign and intrinsic acquired stenosis was found in the distal duodenal bulb.  The previously seen duodenitis and ulceration has healed .  Dilation was then performed at the gastroesophageal junction Dilator:Balloon Size:15 mm and 16.5 mm  Resistance: moderate at 16.5 mm only Heme: yes with small mucosal tear  Appearance:satisfactory   COMPLICATIONS: There were no complications.  ENDOSCOPIC IMPRESSION: Schatzki ring was found at the gastroesophageal junction and 35 cm from the incisors. Hiatal hernia Normal stomach Benign, acquired duodenal stenosis at the duodenal sweep. Previously seen ulcers have healed  RECOMMENDATIONS: 1.  Continue taking your PPI (pantoprazole) once daily.  It is best to be taken 20-30 minutes prior to breakfast meal. 2.  Avoid NSAIDs 3.  Clear liquids until evening, and then soft foods rest of  today. Resume prior diet tomorrow.   eSigned:  Beverley Fiedler, MD 05/02/2013 10:13 AM               CC:The Patient; Rodman Pickle, MD  PATIENT NAME:  [name], [name] MR#: [id number]

## 2013-05-02 NOTE — Progress Notes (Signed)
Report to pacu rn, vss, bbs=clear, Patient given Jaw thrust to open airway with good resu;lts, ventilating well to pacu SAO2=98%

## 2013-05-02 NOTE — Progress Notes (Signed)
First balloon was 15mm.  Per Dr. Rhea Belton need larger size balloon.  Second balloon to 16.30mm.  Heme positive. The pt tolerated the esophageal dilatation very well. Maw

## 2013-05-05 ENCOUNTER — Telehealth: Payer: Self-pay | Admitting: *Deleted

## 2013-05-05 NOTE — Telephone Encounter (Signed)
Left message that we called for f/u 

## 2013-05-07 ENCOUNTER — Ambulatory Visit (AMBULATORY_SURGERY_CENTER): Payer: Self-pay

## 2013-05-07 VITALS — Ht 67.0 in | Wt 207.0 lb

## 2013-05-07 DIAGNOSIS — K625 Hemorrhage of anus and rectum: Secondary | ICD-10-CM

## 2013-05-07 MED ORDER — MOVIPREP 100 G PO SOLR: 1.0000 | Freq: Once | ORAL | Status: DC

## 2013-05-12 ENCOUNTER — Ambulatory Visit (AMBULATORY_SURGERY_CENTER): Payer: 59 | Admitting: Internal Medicine

## 2013-05-12 ENCOUNTER — Encounter: Payer: Self-pay | Admitting: Internal Medicine

## 2013-05-12 VITALS — BP 138/99 | HR 83 | Temp 99.2°F | Resp 25 | Ht 67.0 in | Wt 207.0 lb

## 2013-05-12 DIAGNOSIS — K625 Hemorrhage of anus and rectum: Secondary | ICD-10-CM

## 2013-05-12 MED ORDER — SODIUM CHLORIDE 0.9 % IV SOLN: 500.0000 mL | INTRAVENOUS | Status: DC

## 2013-05-12 NOTE — Patient Instructions (Signed)
Discharge instructions given with verbal understanding. Handouts on hemorrhoids and a high fiber diet. Resume previous medications. YOU HAD AN ENDOSCOPIC PROCEDURE TODAY AT THE [facility]: Refer to the procedure report that was given to you for any specific questions about what was found during the examination.  If the procedure report does not answer your questions, please call your gastroenterologist to clarify.  If you requested that your care partner not be given the details of your procedure findings, then the procedure report has been included in a sealed envelope for you to review at your convenience later.  YOU SHOULD EXPECT: Some feelings of bloating in the abdomen. Passage of more gas than usual.  Walking can help get rid of the air that was put into your GI tract during the procedure and reduce the bloating. If you had a lower endoscopy (such as a colonoscopy or flexible sigmoidoscopy) you may notice spotting of blood in your stool or on the toilet paper. If you underwent a bowel prep for your procedure, then you may not have a normal bowel movement for a few days.  DIET: Your first meal following the procedure should be a light meal and then it is ok to progress to your normal diet.  A half-sandwich or bowl of soup is an example of a good first meal.  Heavy or fried foods are harder to digest and may make you feel nauseous or bloated.  Likewise meals heavy in dairy and vegetables can cause extra gas to form and this can also increase the bloating.  Drink plenty of fluids but you should avoid alcoholic beverages for 24 hours.  ACTIVITY: Your care partner should take you home directly after the procedure.  You should plan to take it easy, moving slowly for the rest of the day.  You can resume normal activity the day after the procedure however you should NOT DRIVE or use heavy machinery for 24 hours (because of the sedation medicines used during the test).    SYMPTOMS TO REPORT  IMMEDIATELY: A gastroenterologist can be reached at any hour.  During normal business hours, 8:30 AM to 5:00 PM Monday through Friday, call [phone].  After hours and on weekends, please call the GI answering service at [phone] who will take a message and have the physician on call contact you.   Following lower endoscopy (colonoscopy or flexible sigmoidoscopy):  Excessive amounts of blood in the stool  Significant tenderness or worsening of abdominal pains  Swelling of the abdomen that is new, acute  Fever of 100F or higher FOLLOW UP: If any biopsies were taken you will be contacted by phone or by letter within the next 1-3 weeks.  Call your gastroenterologist if you have not heard about the biopsies in 3 weeks.  Our staff will call the home number listed on your records the next business day following your procedure to check on you and address any questions or concerns that you may have at that time regarding the information given to you following your procedure. This is a courtesy call and so if there is no answer at the home number and we have not heard from you through the emergency physician on call, we will assume that you have returned to your regular daily activities without incident.  SIGNATURES/CONFIDENTIALITY: You and/or your care partner have signed paperwork which will be entered into your electronic medical record.  These signatures attest to the fact that that the information above on your After   Visit Summary has been reviewed and is understood.  Full responsibility of the confidentiality of this discharge information lies with you and/or your care-partner. 

## 2013-05-12 NOTE — Progress Notes (Signed)
Report to pacu rn, vss, bbs=clear 

## 2013-05-12 NOTE — Progress Notes (Signed)
Patient did not experience any of the following events: a burn prior to discharge; a fall within the facility; wrong site/side/patient/procedure/implant event; or a hospital transfer or hospital admission upon discharge from the facility. (G8907) Patient did not have preoperative order for IV antibiotic SSI prophylaxis. (G8918)  

## 2013-05-12 NOTE — Op Note (Signed)
[facility] [address] [place], [address]   COLONOSCOPY PROCEDURE REPORT  PATIENT: Maureen Chen, Maureen Chen  MR#: [id number] BIRTHDATE: [birth year] , 47  yrs. old GENDER: Female ENDOSCOPIST: Beverley Fiedler, MD PROCEDURE DATE:  05/12/2013 PROCEDURE:   Colonoscopy, diagnostic First Screening Colonoscopy - Avg.  risk and is 50 yrs.  old or older - No.  Prior Negative Screening - Now for repeat screening. N/A  History of Adenoma - Now for follow-up colonoscopy & has been > or = to 3 yrs.  N/A  Polyps Removed Today? No.  Recommend repeat exam, <10 yrs? No. ASA CLASS:   Class III INDICATIONS:Rectal Bleeding. MEDICATIONS: MAC sedation, administered by CRNA, propofol (Diprivan) 350mg  IV  DESCRIPTION OF PROCEDURE:   After the risks benefits and alternatives of the procedure were thoroughly explained, informed consent was obtained.  A digital rectal exam revealed external hemorrhoids.   The LB [id number]  endoscope was introduced through the anus and advanced to the cecum, which was identified by both the appendix and ileocecal valve. No adverse events experienced.   The quality of the prep was excellent, using MoviPrep  The instrument was then slowly withdrawn as the colon was fully examined.   COLON FINDINGS: A normal appearing cecum, ileocecal valve, and appendiceal orifice were identified.  The ascending, hepatic flexure, transverse, splenic flexure, descending, sigmoid colon and rectum appeared unremarkable.  No polyps or cancers were seen. Retroflexed views revealed external hemorrhoids. The time to cecum=1 minutes 20 seconds.  Withdrawal time=8 minutes 16 seconds. The scope was withdrawn and the procedure completed.  COMPLICATIONS: There were no complications.  ENDOSCOPIC IMPRESSION: 1.  Normal colon 2.  Small external hemorrhoids  RECOMMENDATIONS: 1.  You should continue to follow colorectal cancer screening guidelines for "routine risk" patients with a  repeat colonoscopy in 10 years.  There is no need for FOBT (stool) testing for at least 5 years. 2.  Over the counter Preparation H can be used for external hemorrhoids.   eSigned:  Beverley Fiedler, MD 05/12/2013 2:26 PM   cc: The Patient; Rodman Pickle, MD

## 2013-05-13 ENCOUNTER — Telehealth: Payer: Self-pay

## 2013-05-13 NOTE — Telephone Encounter (Signed)
  Follow up Call-  Call back number 05/12/2013 05/02/2013 10/09/2012 09/16/2012 07/18/2012  Post procedure Call Back phone  # [phone]  Permission to leave phone message Yes Yes Yes Yes Yes     Patient questions:  Do you have a fever, pain , or abdominal swelling? no Pain Score  0 *  Have you tolerated food without any problems? yes  Have you been able to return to your normal activities? yes  Do you have any questions about your discharge instructions: Diet   no Medications  no Follow up visit  no  Do you have questions or concerns about your Care? no  Actions: * If pain score is 4 or above: No action needed, pain <4.

## 2013-05-22 ENCOUNTER — Other Ambulatory Visit: Payer: Self-pay

## 2013-06-06 NOTE — Telephone Encounter (Signed)
Nurse documented in another note 05/02/13

## 2013-12-15 ENCOUNTER — Telehealth: Payer: Self-pay | Admitting: Internal Medicine

## 2013-12-15 NOTE — Telephone Encounter (Signed)
Patient wants to have a dilation this week.  She is advised that Dr. Rhea Belton does not have any openings at this time for this week.  She has this week off.  She does not want to schedule an appt unless it can be done this week.  She will call back

## 2013-12-15 NOTE — Telephone Encounter (Signed)
Patient notified

## 2013-12-16 ENCOUNTER — Other Ambulatory Visit: Payer: Self-pay | Admitting: Internal Medicine

## 2013-12-16 ENCOUNTER — Ambulatory Visit (AMBULATORY_SURGERY_CENTER): Payer: Self-pay

## 2013-12-16 VITALS — Ht 67.0 in | Wt 219.0 lb

## 2013-12-16 DIAGNOSIS — R1319 Other dysphagia: Secondary | ICD-10-CM

## 2013-12-16 NOTE — Progress Notes (Signed)
No allergies to eggs or soy No home oxygen No diet/weight loss meds No past problems with anesthesia Has email  Emmi instructions given

## 2013-12-16 NOTE — Telephone Encounter (Signed)
Patient with dysphagia.  She would like to have a dilation this week. Last dil was 04/2013.  Ok to set up EGD directly?

## 2013-12-17 ENCOUNTER — Encounter: Payer: Self-pay | Admitting: Internal Medicine

## 2013-12-17 ENCOUNTER — Ambulatory Visit (AMBULATORY_SURGERY_CENTER): Payer: 59 | Admitting: Internal Medicine

## 2013-12-17 VITALS — BP 139/64 | HR 74 | Temp 98.7°F | Resp 19 | Ht 67.0 in | Wt 207.0 lb

## 2013-12-17 DIAGNOSIS — K222 Esophageal obstruction: Secondary | ICD-10-CM

## 2013-12-17 DIAGNOSIS — R1319 Other dysphagia: Secondary | ICD-10-CM

## 2013-12-17 DIAGNOSIS — F1990 Other psychoactive substance use, unspecified, uncomplicated: Secondary | ICD-10-CM | POA: Insufficient documentation

## 2013-12-17 DIAGNOSIS — Q393 Congenital stenosis and stricture of esophagus: Secondary | ICD-10-CM

## 2013-12-17 DIAGNOSIS — K269 Duodenal ulcer, unspecified as acute or chronic, without hemorrhage or perforation: Secondary | ICD-10-CM

## 2013-12-17 DIAGNOSIS — Q391 Atresia of esophagus with tracheo-esophageal fistula: Secondary | ICD-10-CM

## 2013-12-17 DIAGNOSIS — R1314 Dysphagia, pharyngoesophageal phase: Secondary | ICD-10-CM

## 2013-12-17 DIAGNOSIS — R1310 Dysphagia, unspecified: Secondary | ICD-10-CM

## 2013-12-17 MED ORDER — PANTOPRAZOLE SODIUM 40 MG PO TBEC: 40.0000 mg | DELAYED_RELEASE_TABLET | Freq: Every day | ORAL | Status: DC

## 2013-12-17 MED ORDER — SODIUM CHLORIDE 0.9 % IV SOLN: 500.0000 mL | INTRAVENOUS | Status: DC

## 2013-12-17 NOTE — Patient Instructions (Signed)
YOU HAD AN ENDOSCOPIC PROCEDURE TODAY AT THE [facility]: Refer to the procedure report that was given to you for any specific questions about what was found during the examination.  If the procedure report does not answer your questions, please call your gastroenterologist to clarify.  If you requested that your care partner not be given the details of your procedure findings, then the procedure report has been included in a sealed envelope for you to review at your convenience later.  YOU SHOULD EXPECT: Some feelings of bloating in the abdomen. Passage of more gas than usual.  Walking can help get rid of the air that was put into your GI tract during the procedure and reduce the bloating. If you had a lower endoscopy (such as a colonoscopy or flexible sigmoidoscopy) you may notice spotting of blood in your stool or on the toilet paper. If you underwent a bowel prep for your procedure, then you may not have a normal bowel movement for a few days.  DIET: See dilation diet-  Heavy or fried foods are harder to digest and may make you feel nauseous or bloated.  Likewise meals heavy in dairy and vegetables can cause extra gas to form and this can also increase the bloating.  Drink plenty of fluids but you should avoid alcoholic beverages for 24 hours.  ACTIVITY: Your care partner should take you home directly after the procedure.  You should plan to take it easy, moving slowly for the rest of the day.  You can resume normal activity the day after the procedure however you should NOT DRIVE or use heavy machinery for 24 hours (because of the sedation medicines used during the test).    SYMPTOMS TO REPORT IMMEDIATELY: A gastroenterologist can be reached at any hour.  During normal business hours, 8:30 AM to 5:00 PM Monday through Friday, call [phone].  After hours and on weekends, please call the GI answering service at [phone] who will take a message and have the physician on call  contact you.   Following upper endoscopy (EGD)  Vomiting of blood or coffee ground material  New chest pain or pain under the shoulder blades  Painful or persistently difficult swallowing  New shortness of breath  Fever of 100F or higher  Black, tarry-looking stools  FOLLOW UP: If any biopsies were taken you will be contacted by phone or by letter within the next 1-3 weeks.  Call your gastroenterologist if you have not heard about the biopsies in 3 weeks.  Our staff will call the home number listed on your records the next business day following your procedure to check on you and address any questions or concerns that you may have at that time regarding the information given to you following your procedure. This is a courtesy call and so if there is no answer at the home number and we have not heard from you through the emergency physician on call, we will assume that you have returned to your regular daily activities without incident.  SIGNATURES/CONFIDENTIALITY: You and/or your care partner have signed paperwork which will be entered into your electronic medical record.  These signatures attest to the fact that that the information above on your After Visit Summary has been reviewed and is understood.  Full responsibility of the confidentiality of this discharge information lies with you and/or your care-partner.  Dilation diet, stricture, hiatal hernia-handouts given  No NSAIDs including Goody's or BC powder.  Repeat dilation as needed.

## 2013-12-17 NOTE — Progress Notes (Signed)
Called to room to assist during endoscopic procedure.  Patient ID and intended procedure confirmed with present staff. Received instructions for my participation in the procedure from the performing physician.  

## 2013-12-17 NOTE — Op Note (Signed)
[facility] [address] [place], [address]   ENDOSCOPY PROCEDURE REPORT  PATIENT: Maureen Chen, Maureen Chen  MR#: [id number] BIRTHDATE: 06/21/1966 , 48  yrs. old GENDER: Female ENDOSCOPIST: Beverley Fiedler, MD ASSISTANT:   Ursula Beath PROCEDURE DATE:  12/17/2013 PROCEDURE:   EGD with balloon dilatation ASA CLASS:   Class III INDICATIONS:dysphagia and history of Schatzki's ring requiring previous dilation, October 2014. MEDICATIONS: MAC sedation, administered by CRNA and propofol (Diprivan) 250mg  IV TOPICAL ANESTHETIC:   none  DESCRIPTION OF PROCEDURE:   After the risks benefits and alternatives of the procedure were thoroughly explained, informed consent was obtained.  The LB [id number]  endoscope was introduced through the mouth  and advanced to the bulb of duodenum ,      The instrument was slowly withdrawn as the mucosa was carefully examined.   ESOPHAGUS: A moderately Schatzki ring was found at the gastroesophageal junction, 35 cm from the incisors   A 4 cm hiatal hernia was noted.   The remaining esophageal mucosa was unremarkable.  STOMACH: There was mild antral gastropathy noted, felt NSAID related.  DUODENUM: Duodenal inflammation was found in the duodenal bulb and duodenal sweep.  There was a small ulceration, clean-based, at the D1/D2 junction .  There is acquired stenosis in the duodenal sweep, which could not be traversed today with the upper endoscope.  The stricture has been previously seen and biopsied  Dilation was then performed at the gastroesophageal junction  Dilator:Balloon Size:13.5 and 15 mm  Resistance:minimal Heme:yes Appearance:satisfactory with superficial mucosal tear  COMPLICATIONS: There were no complications.  ENDOSCOPIC IMPRESSION: 1.   Schatzki ring was found at the gastroesophageal junction, balloon dilation to 15 mm 2.   4 cm hiatal hernia 3.   There was mild antral gastropathy noted 4.   Duodenal inflammation  with ulceration and acquired stenosis was found in the duodenal bulb and duodenal sweep     RECOMMENDATIONS: 1.  Continue pantoprazole 40 mg daily, indefinitely 2.  No NSAIDs, including Goody's or BC Powder 3.  Repeat dilation as needed   eSigned:  Beverley Fiedler, MD 12/17/2013 1:10 PM  CC: Janeece Fitting, MD, The Patient  PATIENT NAME:  Maureen Chen, Maureen Chen MR#: [id number]

## 2013-12-17 NOTE — Progress Notes (Signed)
A/ox3, pleased with MAC, report to RN 

## 2013-12-18 ENCOUNTER — Telehealth: Payer: Self-pay | Admitting: *Deleted

## 2013-12-18 NOTE — Telephone Encounter (Signed)
  Follow up Call-  Call back number 12/17/2013 05/12/2013 05/02/2013 10/09/2012 09/16/2012  Post procedure Call Back phone  # [phone]  Permission to leave phone message Yes Yes Yes Yes Yes     Patient questions:  Do you have a fever, pain , or abdominal swelling? no Pain Score  0 *  Have you tolerated food without any problems? yes  Have you been able to return to your normal activities? yes  Do you have any questions about your discharge instructions: Diet   no Medications  no Follow up visit  no  Do you have questions or concerns about your Care? no  Actions: * If pain score is 4 or above: No action needed, pain <4.

## 2014-08-27 ENCOUNTER — Telehealth: Payer: Self-pay | Admitting: Internal Medicine

## 2014-08-27 DIAGNOSIS — R1310 Dysphagia, unspecified: Secondary | ICD-10-CM

## 2014-08-27 NOTE — Telephone Encounter (Signed)
Patient states food is getting stuck again. She is asking for direct EGD. Please, advise.

## 2014-08-27 NOTE — Telephone Encounter (Signed)
Spoke with patient and scheduled EGD with dil on 09/10/14 at 9:00 AM. Pre visit scheduled on 09/07/14 2:00 PM.

## 2014-08-27 NOTE — Telephone Encounter (Signed)
Left a message for patient to call back. 

## 2014-08-27 NOTE — Telephone Encounter (Signed)
Okay for upper endoscopy with dilation Does not need to be seen in the office visit and less on anticoagulant therapy Aspirin should be held for 5 days before procedure

## 2014-09-07 ENCOUNTER — Ambulatory Visit (AMBULATORY_SURGERY_CENTER): Payer: Self-pay | Admitting: *Deleted

## 2014-09-07 VITALS — Ht 67.0 in | Wt 215.0 lb

## 2014-09-07 DIAGNOSIS — R1314 Dysphagia, pharyngoesophageal phase: Secondary | ICD-10-CM

## 2014-09-07 NOTE — Progress Notes (Signed)
No egg or soy allergy  No anesthesia or intubation problems per pt  No diet medications taken  Registered in EMMI   

## 2014-09-10 ENCOUNTER — Ambulatory Visit (AMBULATORY_SURGERY_CENTER): Payer: Self-pay | Admitting: Internal Medicine

## 2014-09-10 ENCOUNTER — Encounter: Payer: Self-pay | Admitting: Internal Medicine

## 2014-09-10 VITALS — BP 126/70 | HR 75 | Temp 97.4°F | Resp 21 | Ht 67.0 in | Wt 215.0 lb

## 2014-09-10 DIAGNOSIS — Q394 Esophageal web: Secondary | ICD-10-CM

## 2014-09-10 DIAGNOSIS — K222 Esophageal obstruction: Secondary | ICD-10-CM

## 2014-09-10 DIAGNOSIS — R1314 Dysphagia, pharyngoesophageal phase: Secondary | ICD-10-CM

## 2014-09-10 DIAGNOSIS — R1319 Other dysphagia: Secondary | ICD-10-CM

## 2014-09-10 DIAGNOSIS — R1310 Dysphagia, unspecified: Secondary | ICD-10-CM

## 2014-09-10 MED ORDER — SODIUM CHLORIDE 0.9 % IV SOLN: 500.0000 mL | INTRAVENOUS | Status: DC

## 2014-09-10 NOTE — Progress Notes (Signed)
Report to PACU, RN, vss, BBS= Clear.  

## 2014-09-10 NOTE — Patient Instructions (Signed)
YOU HAD AN ENDOSCOPIC PROCEDURE TODAY AT THE [facility]: Refer to the procedure report that was given to you for any specific questions about what was found during the examination.  If the procedure report does not answer your questions, please call your gastroenterologist to clarify.  If you requested that your care partner not be given the details of your procedure findings, then the procedure report has been included in a sealed envelope for you to review at your convenience later.  YOU SHOULD EXPECT: Some feelings of bloating in the abdomen. Passage of more gas than usual.  Walking can help get rid of the air that was put into your GI tract during the procedure and reduce the bloating. If you had a lower endoscopy (such as a colonoscopy or flexible sigmoidoscopy) you may notice spotting of blood in your stool or on the toilet paper. If you underwent a bowel prep for your procedure, then you may not have a normal bowel movement for a few days.  DIET: FOLLOW DILATATION DIET GIVEN TO YOU TODAY   ACTIVITY: Your care partner should take you home directly after the procedure.  You should plan to take it easy, moving slowly for the rest of the day.  You can resume normal activity the day after the procedure however you should NOT DRIVE or use heavy machinery for 24 hours (because of the sedation medicines used during the test).    SYMPTOMS TO REPORT IMMEDIATELY: A gastroenterologist can be reached at any hour.  During normal business hours, 8:30 AM to 5:00 PM Monday through Friday, call [phone].  After hours and on weekends, please call the GI answering service at [phone] who will take a message and have the physician on call contact you.     Following upper endoscopy (EGD)  Vomiting of blood or coffee ground material  New chest pain or pain under the shoulder blades  Painful or persistently difficult swallowing  New shortness of breath  Fever of 100F or higher  Black,  tarry-looking stools  FOLLOW UP: If any biopsies were taken you will be contacted by phone or by letter within the next 1-3 weeks.  Call your gastroenterologist if you have not heard about the biopsies in 3 weeks.  Our staff will call the home number listed on your records the next business day following your procedure to check on you and address any questions or concerns that you may have at that time regarding the information given to you following your procedure. This is a courtesy call and so if there is no answer at the home number and we have not heard from you through the emergency physician on call, we will assume that you have returned to your regular daily activities without incident.  SIGNATURES/CONFIDENTIALITY: You and/or your care partner have signed paperwork which will be entered into your electronic medical record.  These signatures attest to the fact that that the information above on your After Visit Summary has been reviewed and is understood.  Full responsibility of the confidentiality of this discharge information lies with you and/or your care-partner.   FOLLOW DILATATION DIET TODAY  CONTINUE REFLUX MEDICATION DAILY  AVOID ANTI INFLAMMATORY PRODUCTS & ASPIRIN  MAY TAKE MIRALAX 1-2 TIME DAILY PER DR PYRTLE  INFORMATION ON REFLUX & HIATAL HERNIA GIVEN TO YOU TODAY

## 2014-09-10 NOTE — Progress Notes (Signed)
Called to room to assist during endoscopic procedure.  Patient ID and intended procedure confirmed with present staff. Received instructions for my participation in the procedure from the performing physician.  

## 2014-09-10 NOTE — Op Note (Signed)
[facility] [address] [place], [id number]   ENDOSCOPY PROCEDURE REPORT  PATIENT: [name], Maureen R  MR#: [id number] BIRTHDATE: 01/29/1966 , 48  yrs. old GENDER: female ENDOSCOPIST: Maureen FiedlerJay M Winfred Iiams, MD PROCEDURE DATE:  09/10/2014 PROCEDURE:  EGD w/ balloon dilation ASA CLASS:     Class II INDICATIONS:  dysphagia, history of Schatzki's ring, last dilation June 2015. MEDICATIONS: Monitored anesthesia care, Propofol 200 mg IV, and lidocaine 200 mg IV TOPICAL ANESTHETIC: none  DESCRIPTION OF PROCEDURE: After the risks benefits and alternatives of the procedure were thoroughly explained, informed consent was obtained.  The LB [id number] endoscope was introduced through the mouth and advanced to the second portion of the duodenum , Without limitations.  The instrument was slowly withdrawn as the mucosa was fully examined.   ESOPHAGUS: A Schatzki ring was found 36 cm from the incisors.  Using a TTS-balloon the stricture was dilated up to 16.5 mm (after inspection at 13.5 mm and 15 mm revealed no change).  The balloon was held inflated for 60 seconds.  Following this dilation, there was a small amount of heme and a small mucosal rent.   Otherwise normal esophagus.  STOMACH: A 4 cm hiatal hernia was noted.   The mucosa of the stomach appeared normal.  DUODENUM: Non-traversable, benign and intrinsic acquired stenosis was found in the distal duodenal bulb at the sweep.  No duodenitis today.  Stricture previous seen, biopsied and remains asymptomatic   Retroflexed views revealed a hiatal hernia.     The scope was then withdrawn from the patient and the procedure completed.  COMPLICATIONS: There were no immediate complications.  ENDOSCOPIC IMPRESSION: 1.   Schatzki ring was found 36 cm from the incisors; Using a TTS-balloon the stricture was dilated up to 16mm; The balloon was held inflated for 60 seconds; Following this dilation, there was a small amount of  heme and a small mucosal rent 2.   4 cm hiatal hernia 3.   The mucosa of the stomach appeared normal 4.   Acquired stenosis was found in the duodenal bulb  RECOMMENDATIONS: 1.  Continue taking your PPI (antiacid medicine) once daily.  It is best to be taken 20-30 minutes prior to breakfast meal. 2.  Continue to avoid NSAIDs (including BC and Goody's Powders) 3.  Repeat dilation when needed  eSigned:  Beverley FiedlerJay M Waymon Laser, MD 09/10/2014 9:23 AM     CC:The Patient, Dr. Eulis FosterHeffner

## 2014-09-11 ENCOUNTER — Telehealth: Payer: Self-pay | Admitting: *Deleted

## 2014-09-11 NOTE — Telephone Encounter (Signed)
  Follow up Call-  Call back number 09/10/2014 12/17/2013 05/12/2013 05/02/2013 10/09/2012 09/16/2012 07/18/2012  Post procedure Call Back phone  # [phone]  Permission to leave phone message Yes Yes Yes Yes Yes Yes Yes     Patient questions:  Do you have a fever, pain , or abdominal swelling? No. Pain Score  0 *  Have you tolerated food without any problems? Yes.    Have you been able to return to your normal activities? Yes.    Do you have any questions about your discharge instructions: Diet   No. Medications  No. Follow up visit  No.  Do you have questions or concerns about your Care? No.  Actions: * If pain score is 4 or above: No action needed, pain <4.  Pt c/o "sore throat", encouraged OTC treatments to help symptoms, and to call us back if worsen. Pt verbalizes understanding. R.Jahnasia Tatum,RN

## 2015-02-12 ENCOUNTER — Telehealth: Payer: Self-pay | Admitting: Internal Medicine

## 2015-02-12 DIAGNOSIS — R1310 Dysphagia, unspecified: Secondary | ICD-10-CM

## 2015-02-12 NOTE — Telephone Encounter (Signed)
Would recommend barium swallow with tablet 1st I want to rule out motility as she is having very freq dilation

## 2015-02-12 NOTE — Telephone Encounter (Signed)
Patient wants to have an repeat EGD for dysphagia. Last EGD 08/2014.  She was offered an office visit for Tuesday she declines, only wants a direct EGD and only on a Friday.  Dr. Rhea Belton please review and advise.

## 2015-02-12 NOTE — Telephone Encounter (Signed)
Patient notified of the recommendations and she is scheduled for 02/19/15 10:30 [facility] [place].  She is asked to be NPO for 3 hours prior.

## 2015-02-19 ENCOUNTER — Ambulatory Visit (HOSPITAL_COMMUNITY): Payer: Self-pay

## 2015-02-23 ENCOUNTER — Ambulatory Visit (HOSPITAL_COMMUNITY)
Admission: RE | Admit: 2015-02-23 | Discharge: 2015-02-23 | Disposition: A | Discharge status: Home / Self Care | Payer: Self-pay | Source: Ambulatory Visit | Attending: Internal Medicine | Admitting: Internal Medicine

## 2015-02-23 ENCOUNTER — Emergency Department (HOSPITAL_COMMUNITY)
Admission: EM | Admit: 2015-02-23 | Discharge: 2015-02-23 | Disposition: A | Discharge status: Home / Self Care | Payer: Self-pay | Attending: Emergency Medicine | Admitting: Emergency Medicine

## 2015-02-23 ENCOUNTER — Encounter (HOSPITAL_COMMUNITY): Payer: Self-pay | Admitting: Emergency Medicine

## 2015-02-23 DIAGNOSIS — K224 Dyskinesia of esophagus: Secondary | ICD-10-CM | POA: Insufficient documentation

## 2015-02-23 DIAGNOSIS — Z79899 Other long term (current) drug therapy: Secondary | ICD-10-CM | POA: Insufficient documentation

## 2015-02-23 DIAGNOSIS — F419 Anxiety disorder, unspecified: Secondary | ICD-10-CM | POA: Insufficient documentation

## 2015-02-23 DIAGNOSIS — K449 Diaphragmatic hernia without obstruction or gangrene: Secondary | ICD-10-CM | POA: Insufficient documentation

## 2015-02-23 DIAGNOSIS — G43909 Migraine, unspecified, not intractable, without status migrainosus: Secondary | ICD-10-CM | POA: Insufficient documentation

## 2015-02-23 DIAGNOSIS — K222 Esophageal obstruction: Secondary | ICD-10-CM | POA: Insufficient documentation

## 2015-02-23 DIAGNOSIS — K219 Gastro-esophageal reflux disease without esophagitis: Secondary | ICD-10-CM | POA: Insufficient documentation

## 2015-02-23 DIAGNOSIS — R1310 Dysphagia, unspecified: Secondary | ICD-10-CM

## 2015-02-23 MED ORDER — KETOROLAC TROMETHAMINE 60 MG/2ML IM SOLN
60.0000 mg | Freq: Once | INTRAMUSCULAR | Status: AC
  Administered 2015-02-23: 60 mg via INTRAMUSCULAR
  Filled 2015-02-23: qty 2

## 2015-02-23 MED ORDER — BUTORPHANOL TARTRATE 2 MG/ML IJ SOLN
2.0000 mg | Freq: Once | INTRAMUSCULAR | Status: AC
  Administered 2015-02-23: 2 mg via INTRAMUSCULAR
  Filled 2015-02-23: qty 1

## 2015-02-23 MED ORDER — PROMETHAZINE HCL 25 MG/ML IJ SOLN
25.0000 mg | Freq: Four times a day (QID) | INTRAMUSCULAR | Status: DC | PRN
  Administered 2015-02-23: 25 mg via INTRAMUSCULAR
  Filled 2015-02-23: qty 1

## 2015-02-23 NOTE — ED Provider Notes (Signed)
CSN: [id number]     Arrival date & time 02/23/15  1020 History   First MD Initiated Contact with Patient 02/23/15 1027     Chief Complaint  Patient presents with  . Migraine     (Consider location/radiation/quality/duration/timing/severity/associated sxs/prior Treatment) The history is provided by the patient and medical records.     Pt with hx migraines p/w her typical migraine.  States she has been having migraines for 30 years, they are behind her left eye and in her left temple 90% of the time and this is where the pain is currently.  It started today around 2am, waking her for sleep.  Sensitive to light and sound, associated nausea.  This is consistent with her migraine pattern.  States when they are this bad she comes to the ED and gets IM stadol, toradol, and phenergan.  This was triggered by her menstrual period, which began yesterday.  She usually gets migraines on the 2nd or 3rd day of her meses.  Denies fevers, recent illness, head trauma, neck pain or stiffness, "worst" headache of life, sudden onset or "thunderclap" headache.  Denies focal neurologic deficits.    Past Medical History  Diagnosis Date  . Depression   . Migraine   . Anxiety   . Drug-seeking behavior   . Dysphagia    Past Surgical History  Procedure Laterality Date  . Dilation and curettage of uterus    . Esophagogastroduodenoscopy  05/15/2012    Procedure: ESOPHAGOGASTRODUODENOSCOPY (EGD);  Surgeon: Beverley Fiedler, MD;  Location: [facility];  Service: Gastroenterology;  Laterality: N/A;  . Upper gastrointestinal endoscopy    . Colonoscopy     Family History  Problem Relation Age of Onset  . Hypertension Father   . Cancer Mother   . Breast cancer Mother   . Colon cancer Neg Hx   . Stomach cancer Neg Hx   . Esophageal cancer Neg Hx   . Rectal cancer Neg Hx    History  Substance Use Topics  . Smoking status: Never Smoker   . Smokeless tobacco: Never Used  . Alcohol Use: Yes     Comment: rarely-  mixed drink   OB History    Gravida Para Term Preterm AB TAB SAB Ectopic Multiple Living   0              Review of Systems  All other systems reviewed and are negative.     Allergies  Prochlorperazine; Duloxetine hcl; and Benadryl  Home Medications   Prior to Admission medications   Medication Sig Start Date End Date Taking? Authorizing Provider  LORazepam (ATIVAN) 1 MG tablet Take 1 mg by mouth every 8 (eight) hours as needed for anxiety.  10/27/14  Yes Historical Provider, MD  methocarbamol (ROBAXIN-750) 750 MG tablet Take 750 mg by mouth every 4 (four) hours as needed for muscle spasms.  02/15/15 03/17/15 Yes Historical Provider, MD  pantoprazole (PROTONIX) 40 MG tablet Take 1 tablet (40 mg total) by mouth daily before breakfast. 12/17/13  Yes Beverley Fiedler, MD  topiramate (TOPAMAX) 100 MG tablet Take 100 mg by mouth daily. 02/15/15 02/14/18 Yes Historical Provider, MD  zolpidem (AMBIEN) 5 MG tablet Take 5 mg by mouth at bedtime as needed for sleep.   Yes Historical Provider, MD   BP 157/92 mmHg  Pulse 105  Temp(Src) 98.3 F (36.8 C) (Oral)  Resp 18  SpO2 100%  LMP 02/19/2015 Physical Exam  Constitutional: She appears well-developed and well-nourished. No distress.  HENT:  Head: Normocephalic and atraumatic.  Neck: Neck supple.  Pulmonary/Chest: Effort normal.  Neurological: She is alert.  CN II-XII intact, EOMs intact, no pronator drift, grip strengths equal bilaterally; strength 5/5 in all extremities, sensation intact in all extremities; finger to nose, heel to shin, rapid alternating movements normal; gait is normal.     Skin: She is not diaphoretic.  Nursing note and vitals reviewed.   ED Course  Procedures (including critical care time) Labs Review Labs Reviewed - No data to display  Imaging Review Dg Esophagus  02/23/2015   CLINICAL DATA:  49 year old female with a history of recurrent esophageal re- stricture requiring multiple endoscopic dilations, most recent in  February 2016, with recurrent sensation of food sticking in the throat and coughing.  EXAM: ESOPHOGRAM / BARIUM SWALLOW / BARIUM TABLET STUDY  TECHNIQUE: Combined double contrast and single contrast examination performed using effervescent crystals, thick barium liquid, and thin barium liquid. The patient was observed with fluoroscopy swallowing a 13 mm barium sulphate tablet.  FLUOROSCOPY TIME:  Fluoroscopy Time:  1 minutes 57 seconds.  Number of Acquired Images:  17  COMPARISON:  10/22/2012 upper GI.  FINDINGS: The oral and pharyngeal phases of swallowing are normal, with no laryngeal penetration or tracheobronchial aspiration. The pharynx appears normal, with no pharyngeal mass, stricture or diverticulum. There is no evidence of cricopharyngeus muscle dysfunction.  Moderate to severe gastroesophageal reflux was elicited with water siphon test to the level of the thoracic inlet. There is a small sliding hiatal hernia. There is mild esophageal dysmotility, characterized by intermittent mild weakening of primary peristalsis in the mid to lower thoracic esophagus. No esophageal ulcer or mass. There is mild circumferential smooth narrowing of the lower thoracic esophageal lumen at the esophagogastric junction, in keeping with a mild peptic stricture. A 12 mm barium tablet was delayed for approximately 10 seconds at the site of the stricture before traversing into the stomach.  IMPRESSION: 1. Moderate to severe gastroesophageal reflux. Small sliding hiatal hernia. 2. Mild esophageal dysmotility, likely due to chronic gastroesophageal reflux disease. 3. Mild benign-appearing peptic stricture at the esophagogastric junction, see comments. 4. Normal oral and pharyngeal phases of swallowing.  Normal pharynx.   Electronically Signed   By: Delbert Phenix M.D.   On: 02/23/2015 11:18     EKG Interpretation None      MDM   Final diagnoses:  None    Afebrile, nontoxic patient with typical migraine headache.  No red  flags including head trauma, fevers, meningeal signs, focal neurologic deficits.  Migraine cocktail  given with relief.    D/C home with PCP follow up.  Discussed result, findings, treatment, and follow up  with patient.  Pt given return precautions.  Pt verbalizes understanding and agrees with plan.      Trixie Dredge, PA-C 02/23/15 1425  Ambrose Finland Clarene Duke, MD 02/24/15 [phone]

## 2015-02-23 NOTE — ED Notes (Signed)
Pt c/o severe headache and nausea onset today, pt states this feels the same as her typical migraines. Pt is menstruating.

## 2015-02-23 NOTE — Discharge Instructions (Signed)
Read the information below.  You may return to the Emergency Department at any time for worsening condition or any new symptoms that concern you.    SEEK MEDICAL ATTENTION IF: You develop possible problems with medications prescribed.  The medications don't resolve your headache, if it recurs , or if you have multiple episodes of vomiting or can't take fluids. You have a change from the usual headache. RETURN IMMEDIATELY IF you develop a sudden, severe headache or confusion, become poorly responsive or faint, develop a fever above 100.12F or problem breathing, have a change in speech, vision, swallowing, or understanding, or develop new weakness, numbness, tingling, incoordination, or have a seizure.   Migraine Headache A migraine headache is an intense, throbbing pain on one or both sides of your head. A migraine can last for 30 minutes to several hours. CAUSES  The exact cause of a migraine headache is not always known. However, a migraine may be caused when nerves in the brain become irritated and release chemicals that cause inflammation. This causes pain. Certain things may also trigger migraines, such as:  Alcohol.  Smoking.  Stress.  Menstruation.  Aged cheeses.  Foods or drinks that contain nitrates, glutamate, aspartame, or tyramine.  Lack of sleep.  Chocolate.  Caffeine.  Hunger.  Physical exertion.  Fatigue.  Medicines used to treat chest pain (nitroglycerine), birth control pills, estrogen, and some blood pressure medicines. SIGNS AND SYMPTOMS  Pain on one or both sides of your head.  Pulsating or throbbing pain.  Severe pain that prevents daily activities.  Pain that is aggravated by any physical activity.  Nausea, vomiting, or both.  Dizziness.  Pain with exposure to bright lights, loud noises, or activity.  General sensitivity to bright lights, loud noises, or smells. Before you get a migraine, you may get warning signs that a migraine is coming  (aura). An aura may include:  Seeing flashing lights.  Seeing bright spots, halos, or zigzag lines.  Having tunnel vision or blurred vision.  Having feelings of numbness or tingling.  Having trouble talking.  Having muscle weakness. DIAGNOSIS  A migraine headache is often diagnosed based on:  Symptoms.  Physical exam.  A CT scan or MRI of your head. These imaging tests cannot diagnose migraines, but they can help rule out other causes of headaches. TREATMENT Medicines may be given for pain and nausea. Medicines can also be given to help prevent recurrent migraines.  HOME CARE INSTRUCTIONS  Only take over-the-counter or prescription medicines for pain or discomfort as directed by your health care provider. The use of long-term narcotics is not recommended.  Lie down in a dark, quiet room when you have a migraine.  Keep a journal to find out what may trigger your migraine headaches. For example, write down:  What you eat and drink.  How much sleep you get.  Any change to your diet or medicines.  Limit alcohol consumption.  Quit smoking if you smoke.  Get 7-9 hours of sleep, or as recommended by your health care provider.  Limit stress.  Keep lights dim if bright lights bother you and make your migraines worse. SEEK IMMEDIATE MEDICAL CARE IF:   Your migraine becomes severe.  You have a fever.  You have a stiff neck.  You have vision loss.  You have muscular weakness or loss of muscle control.  You start losing your balance or have trouble walking.  You feel faint or pass out.  You have severe symptoms that are different  from your first symptoms. MAKE SURE YOU:   Understand these instructions.  Will watch your condition.  Will get help right away if you are not doing well or get worse. Document Released: 07/03/2005 Document Revised: 11/17/2013 Document Reviewed: 03/10/2013 [facility] Patient Information 2015 [place], [place]. This information is not  intended to replace advice given to you by your health care provider. Make sure you discuss any questions you have with your health care provider.

## 2015-02-25 ENCOUNTER — Telehealth: Payer: Self-pay

## 2015-02-25 ENCOUNTER — Telehealth: Payer: Self-pay | Admitting: Internal Medicine

## 2015-02-25 NOTE — Telephone Encounter (Signed)
Pt calling for results of barium swallow, please advise. 

## 2015-02-25 NOTE — Telephone Encounter (Signed)
See result note.  

## 2015-04-19 ENCOUNTER — Other Ambulatory Visit: Payer: Self-pay | Admitting: Internal Medicine

## 2015-05-12 ENCOUNTER — Ambulatory Visit (AMBULATORY_SURGERY_CENTER): Payer: Self-pay

## 2015-05-12 VITALS — Ht 64.5 in | Wt 213.0 lb

## 2015-05-12 DIAGNOSIS — R1310 Dysphagia, unspecified: Secondary | ICD-10-CM

## 2015-05-12 NOTE — Progress Notes (Signed)
No allergies to eggs or soy No past problems with anesthesia No home oxygen No diet/weight loss meds  Has internet; has had egd

## 2015-05-14 ENCOUNTER — Telehealth: Payer: Self-pay | Admitting: Internal Medicine

## 2015-05-14 ENCOUNTER — Encounter: Payer: Self-pay | Admitting: Internal Medicine

## 2015-05-14 ENCOUNTER — Ambulatory Visit (AMBULATORY_SURGERY_CENTER): Payer: Self-pay | Admitting: Internal Medicine

## 2015-05-14 VITALS — BP 112/53 | HR 77 | Temp 98.7°F | Resp 18 | Ht 64.5 in | Wt 213.0 lb

## 2015-05-14 DIAGNOSIS — R1310 Dysphagia, unspecified: Secondary | ICD-10-CM

## 2015-05-14 DIAGNOSIS — K222 Esophageal obstruction: Secondary | ICD-10-CM

## 2015-05-14 DIAGNOSIS — R1314 Dysphagia, pharyngoesophageal phase: Secondary | ICD-10-CM

## 2015-05-14 DIAGNOSIS — R1319 Other dysphagia: Secondary | ICD-10-CM

## 2015-05-14 DIAGNOSIS — Q394 Esophageal web: Secondary | ICD-10-CM

## 2015-05-14 MED ORDER — SODIUM CHLORIDE 0.9 % IV SOLN: 500.0000 mL | INTRAVENOUS | Status: DC

## 2015-05-14 NOTE — Patient Instructions (Signed)
YOU HAD AN ENDOSCOPIC PROCEDURE TODAY AT THE [facility]:   Refer to the procedure report that was given to you for any specific questions about what was found during the examination.  If the procedure report does not answer your questions, please call your gastroenterologist to clarify.  If you requested that your care partner not be given the details of your procedure findings, then the procedure report has been included in a sealed envelope for you to review at your convenience later.  YOU SHOULD EXPECT: Some feelings of bloating in the abdomen. Passage of more gas than usual.  Walking can help get rid of the air that was put into your GI tract during the procedure and reduce the bloating. If you had a lower endoscopy (such as a colonoscopy or flexible sigmoidoscopy) you may notice spotting of blood in your stool or on the toilet paper. If you underwent a bowel prep for your procedure, you may not have a normal bowel movement for a few days.  Please Note:  You might notice some irritation and congestion in your nose or some drainage.  This is from the oxygen used during your procedure.  There is no need for concern and it should clear up in a day or so.  SYMPTOMS TO REPORT IMMEDIATELY:     Following upper endoscopy (EGD)  Vomiting of blood or coffee ground material  New chest pain or pain under the shoulder blades  Painful or persistently difficult swallowing  New shortness of breath  Fever of 100F or higher  Black, tarry-looking stools  For urgent or emergent issues, a gastroenterologist can be reached at any hour by calling [phone].   DIET:FOLLOW DILATATION DIET GIVEN TO YOU TODAY  ACTIVITY:  You should plan to take it easy for the rest of today and you should NOT DRIVE or use heavy machinery until tomorrow (because of the sedation medicines used during the test).    FOLLOW UP: Our staff will call the number listed on your records the next business day following  your procedure to check on you and address any questions or concerns that you may have regarding the information given to you following your procedure. If we do not reach you, we will leave a message.  However, if you are feeling well and you are not experiencing any problems, there is no need to return our call.  We will assume that you have returned to your regular daily activities without incident.  If any biopsies were taken you will be contacted by phone or by letter within the next 1-3 weeks.  Please call us at [phone] if you have not heard about the biopsies in 3 weeks.    SIGNATURES/CONFIDENTIALITY: You and/or your care partner have signed paperwork which will be entered into your electronic medical record.  These signatures attest to the fact that that the information above on your After Visit Summary has been reviewed and is understood.  Full responsibility of the confidentiality of this discharge information lies with you and/or your care-partner   Gastroesophageal reflux information given to you today   FOLLOW DILATATION DIET TODAY

## 2015-05-14 NOTE — Op Note (Signed)
[facility] [address] [place], [id number]   ENDOSCOPY PROCEDURE REPORT  PATIENT: Maureen OuchLawson, Maureen R  MR#: [id number] BIRTHDATE: [birth year] , 49  yrs. old GENDER: female ENDOSCOPIST: Beverley FiedlerJay M Tel Hevia, MD PROCEDURE DATE:  05/14/2015 PROCEDURE:  EGD w/ balloon dilation ASA CLASS:     Class II INDICATIONS:  dysphagia and History of Schatzki's ring with hiatal hernia, last dilation February 2016 to16 mm. MEDICATIONS: Monitored anesthesia care and Propofol 250 mg IV TOPICAL ANESTHETIC: none  DESCRIPTION OF PROCEDURE: After the risks benefits and alternatives of the procedure were thoroughly explained, informed consent was obtained.  The LB [id number] endoscope was introduced through the mouth and advanced to the second portion of the duodenum , Without limitations.  The instrument was slowly withdrawn as the mucosa was fully examined.   ESOPHAGUS: A mildly severe severe Schatzki's ring was found 34 cm from the incisors.  Using a TTS-balloon the stricture was dilated up to 17mm.  The balloon was held inflated for 60 seconds. Following this dilation, there was a small mucosal rent.  Cold forceps were then used to biopsy around the ring for further ring disruption. Biopsies discarded as stricture known to be benign  STOMACH: A 4 cm hiatal hernia was noted.   The mucosa of the stomach appeared normal.  DUODENUM: Non-traversable, benign and intrinsic acquired stenosis was found in the duodenal bulb at D2 junction.  No duodenitis. Retroflexed views revealed a hiatal hernia.     The scope was then withdrawn from the patient and the procedure completed.  COMPLICATIONS: There were no immediate complications.  ENDOSCOPIC IMPRESSION: 1.   Schatzki's ring was found 34 cm from the incisors; balloon dilation to 17 mm; ring further disrupted using cold forceps 2.   4 cm hiatal hernia 3.   The mucosa of the stomach appeared normal 4.   Peptic stenosis was found in the  duodenal bulb  RECOMMENDATIONS: Continue PPI, reflux precautions, repeat dilation as needed  eSigned:  Beverley FiedlerJay M Sudiksha Victor, MD 05/14/2015 4:22 PM    CC: the patient, PCP

## 2015-05-14 NOTE — Progress Notes (Signed)
A/ox3 pleased with MAC, [facility]

## 2015-05-14 NOTE — Telephone Encounter (Signed)
Returned phone call to patient and reviewed med list for accuracy and completeness.

## 2015-05-14 NOTE — Progress Notes (Signed)
Called to room to assist during endoscopic procedure.  Patient ID and intended procedure confirmed with present staff. Received instructions for my participation in the procedure from the performing physician.  

## 2015-05-17 ENCOUNTER — Telehealth: Payer: Self-pay | Admitting: *Deleted

## 2015-05-17 NOTE — Telephone Encounter (Signed)
Message left

## 2015-07-16 ENCOUNTER — Other Ambulatory Visit: Payer: Self-pay | Admitting: Internal Medicine

## 2015-07-16 MED ORDER — PANTOPRAZOLE SODIUM 40 MG PO TBEC: 40.0000 mg | DELAYED_RELEASE_TABLET | Freq: Every day | ORAL | Status: DC

## 2015-09-27 IMAGING — RF DG ESOPHAGUS
11 of 18 series · 14 of 24 positions shown · non-contrast
Comparison: 10/22/2012 upper GI.

CLINICAL DATA: 41-year-old female with a history of recurrent
esophageal re- stricture requiring multiple endoscopic dilations,
most recent in August 2014, with recurrent sensation of food
sticking in the throat and coughing.

EXAM:
ESOPHOGRAM / BARIUM SWALLOW / BARIUM TABLET STUDY
TECHNIQUE: Combined double contrast and single contrast examination performed
using effervescent crystals, thick barium liquid, and thin barium
liquid. The patient was observed with fluoroscopy swallowing a 13 mm
barium sulphate tablet.
FLUOROSCOPY TIME:  Fluoroscopy Time:  1 minutes 57 seconds.
Number of Acquired Images:  17

[Series 1: run · 4 of 10 slices shown (1 of 11)]
[im 1/10]
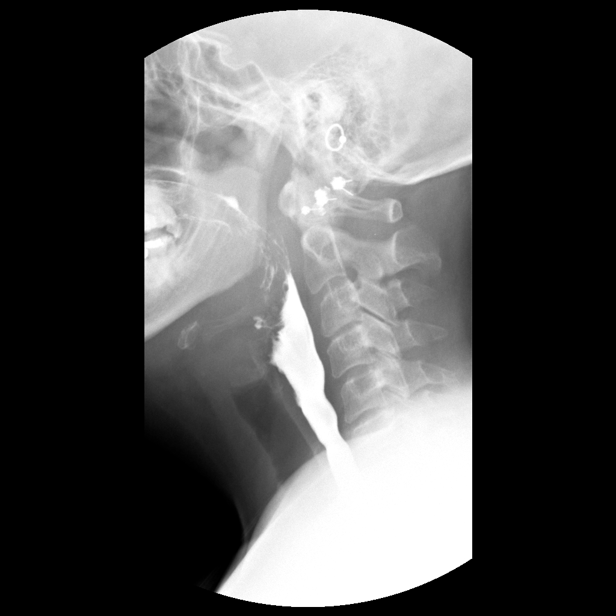
[im 4/10]
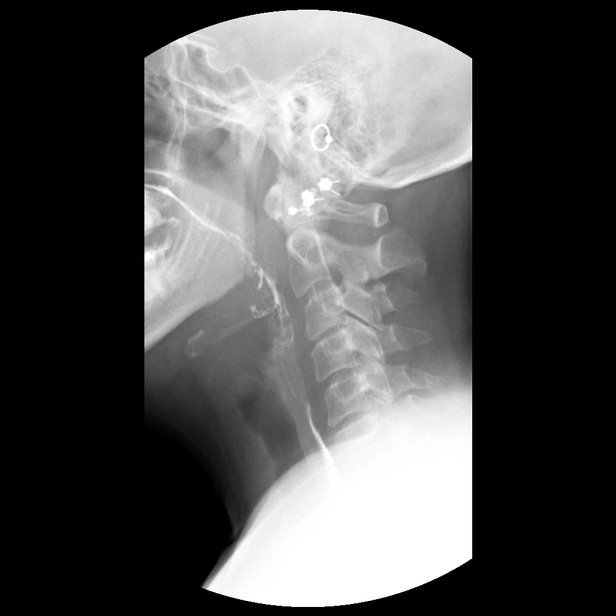
[im 7/10]
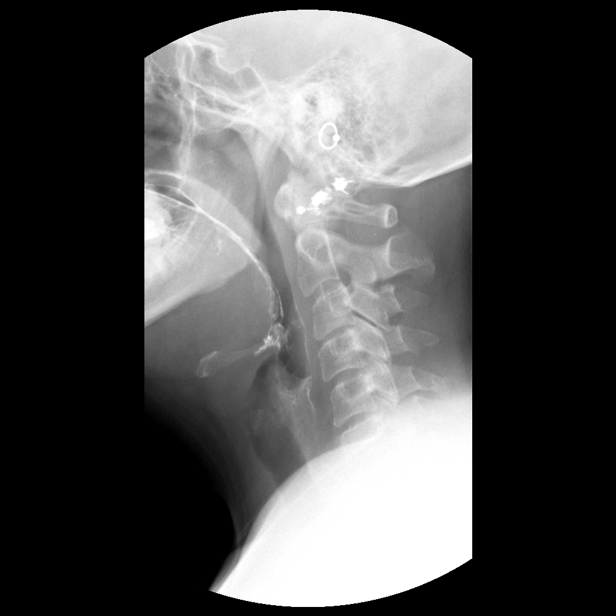
[im 10/10]
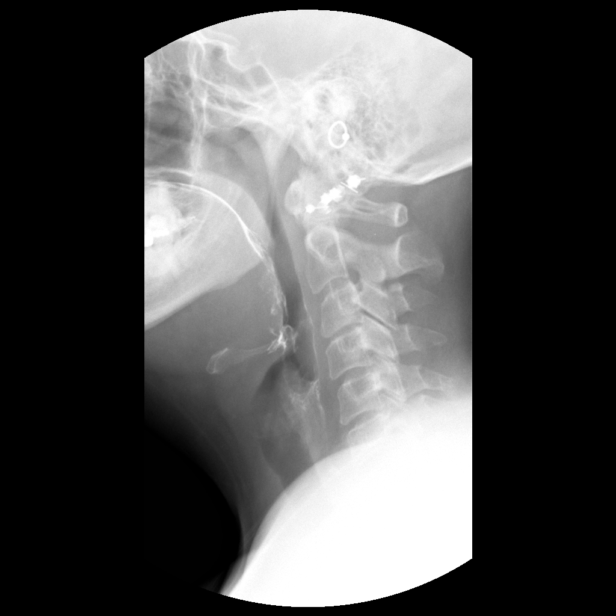

[Series 2: run · 1 of 1 slices shown (2 of 11)]
[im 1/1]
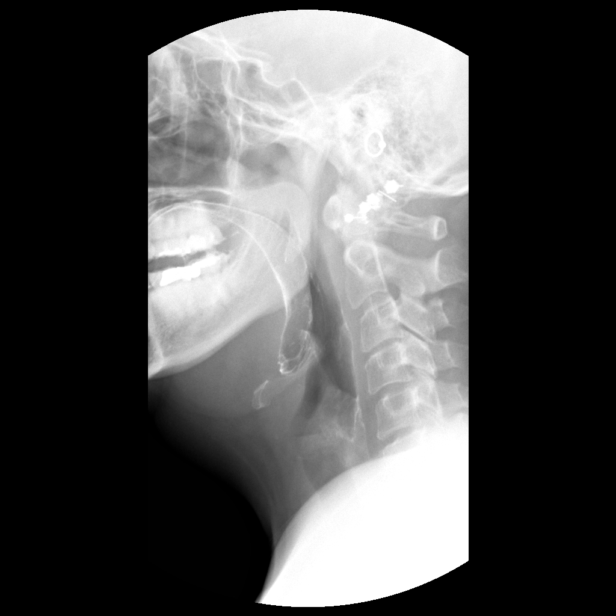

[Series 4: run · 1 of 1 slices shown (3 of 11)]
[im 1/1]
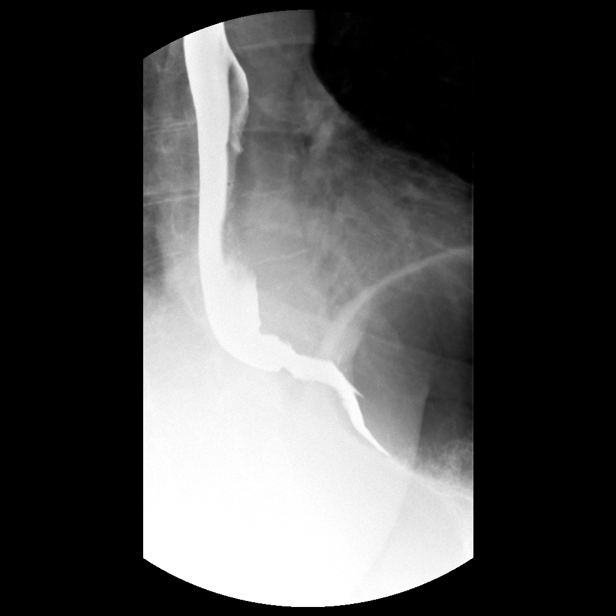

[Series 6: run · 1 of 1 slices shown (4 of 11)]
[im 1/1]
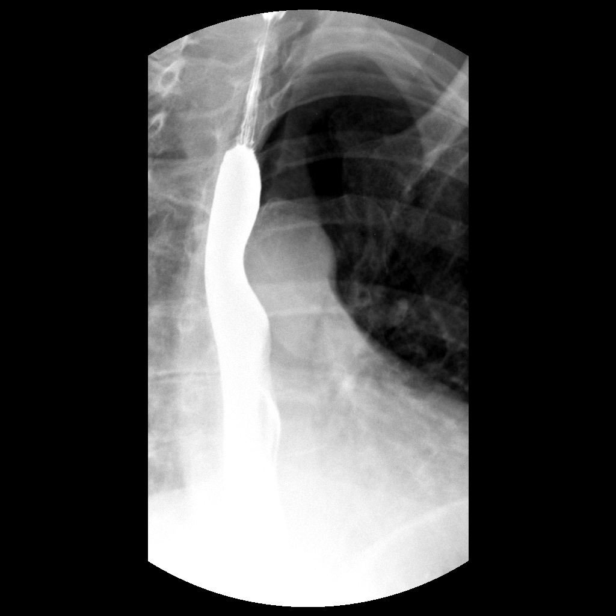

[Series 7: run · 1 of 1 slices shown (5 of 11)]
[im 1/1]
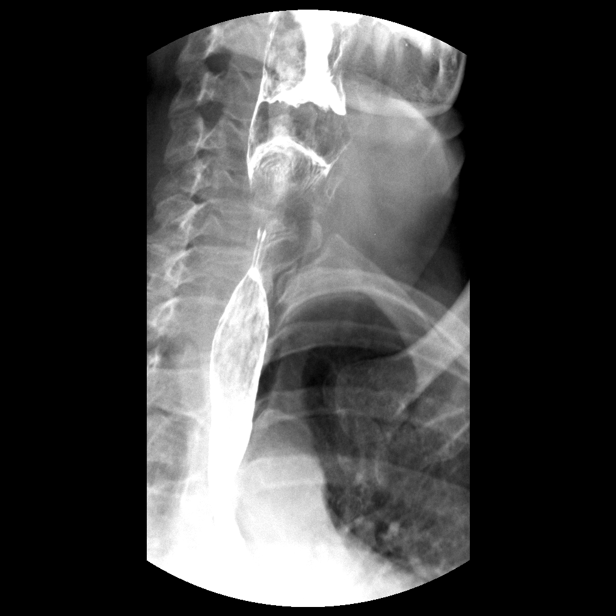

[Series 9: run · 1 of 1 slices shown (6 of 11)]
[im 1/1]
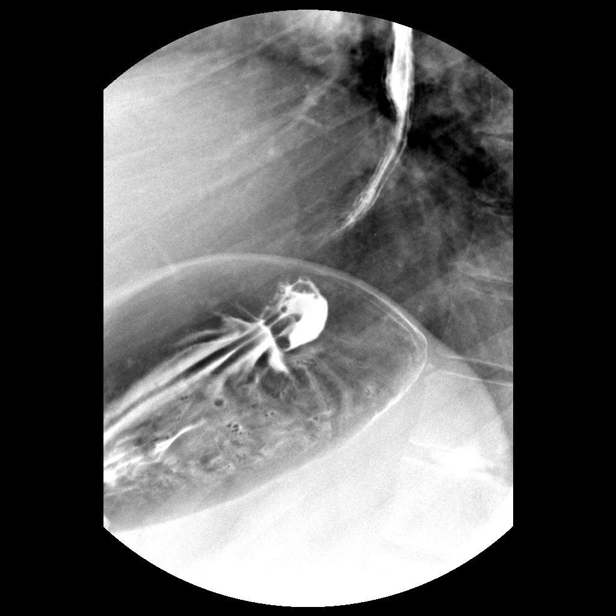

[Series 11: run · 1 of 1 slices shown (7 of 11)]
[im 1/1]
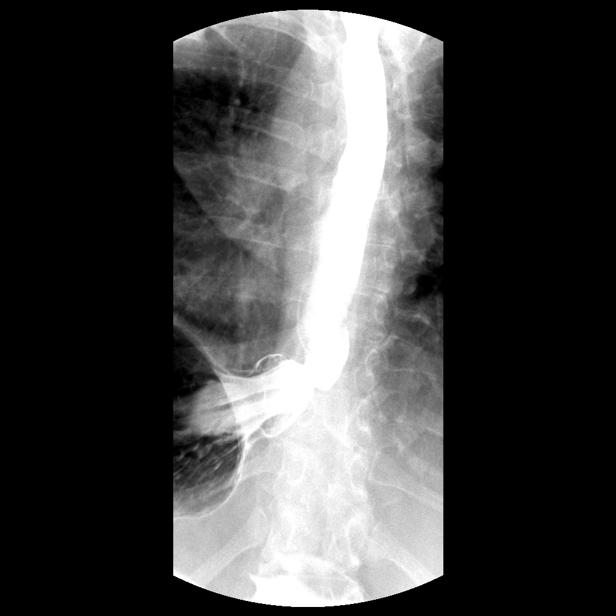

[Series 13: run · 1 of 1 slices shown (8 of 11)]
[im 1/1]
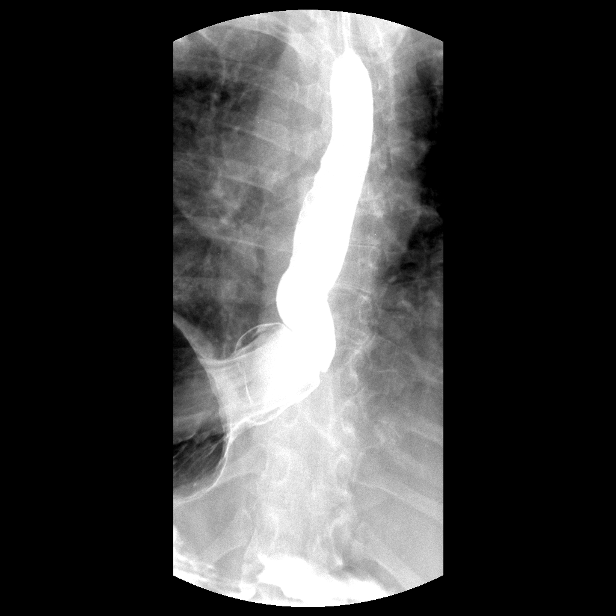

[Series 14: run · 1 of 1 slices shown (9 of 11)]
[im 1/1]
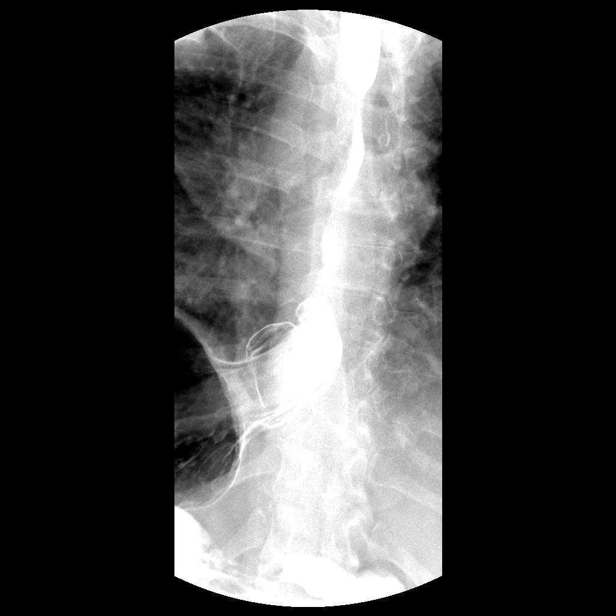

[Series 16: run · 1 of 1 slices shown (10 of 11)]
[im 1/1]
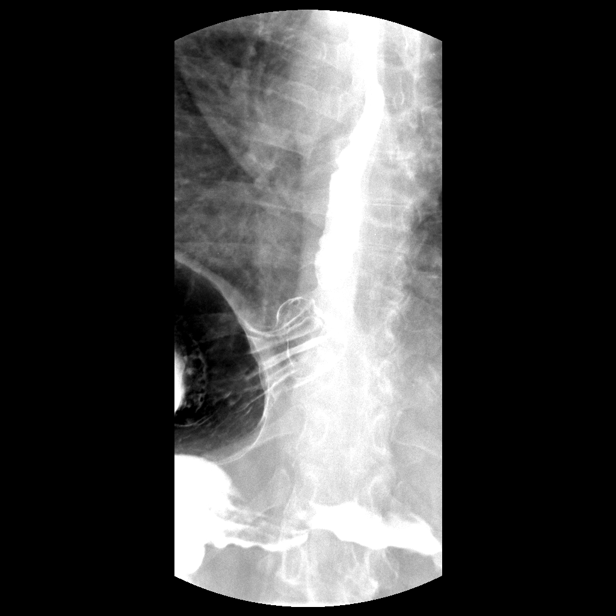

[Series 18: run · 1 of 1 slices shown (11 of 11)]
[im 1/1]
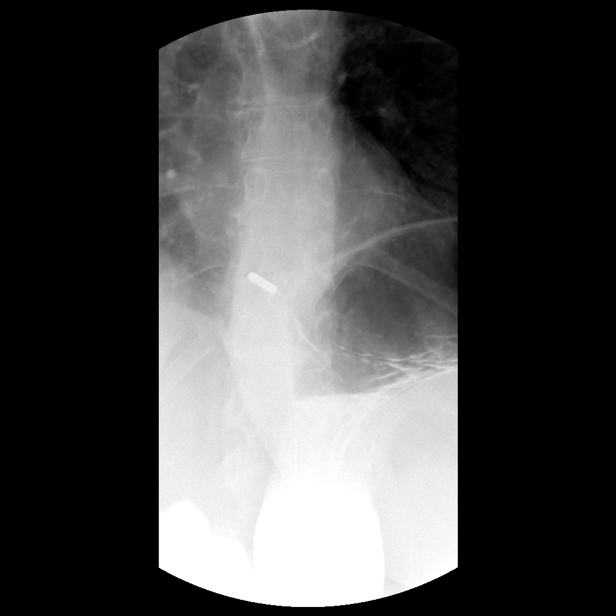

[14 of 24 positions shown; findings below may reference images not displayed]

FINDINGS: The oral and pharyngeal phases of swallowing are normal, with no
laryngeal penetration or tracheobronchial aspiration. The pharynx
appears normal, with no pharyngeal mass, stricture or diverticulum.
There is no evidence of cricopharyngeus muscle dysfunction.

Moderate to severe gastroesophageal reflux was elicited with water
siphon test to the level of the thoracic inlet. There is a small
sliding hiatal hernia. There is mild esophageal dysmotility,
characterized by intermittent mild weakening of primary peristalsis
in the mid to lower thoracic esophagus. No esophageal ulcer or mass.
There is mild circumferential smooth narrowing of the lower thoracic
esophageal lumen at the esophagogastric junction, in keeping with a
mild peptic stricture. A 12 mm barium tablet was delayed for
approximately 10 seconds at the site of the stricture before
traversing into the stomach.
IMPRESSION: 1. Moderate to severe gastroesophageal reflux. Small sliding hiatal
hernia.
2. Mild esophageal dysmotility, likely due to chronic
gastroesophageal reflux disease.
3. Mild benign-appearing peptic stricture at the esophagogastric
junction, see comments.
4. Normal oral and pharyngeal phases of swallowing.  Normal pharynx.

## 2015-10-18 ENCOUNTER — Telehealth: Payer: Self-pay | Admitting: Internal Medicine

## 2015-10-19 NOTE — Telephone Encounter (Signed)
Pt aware and wants to see Dr. Rhea BeltonPyrtle. Pt scheduled to see Dr. Rhea BeltonPyrtle 12/17/15@4pm , pt declined zofran. Pt aware of appt.

## 2015-10-19 NOTE — Telephone Encounter (Signed)
First available OV with Dr. Rhea BeltonPyrtle is in June. Is it ok for pt to see an APP? Please advise.

## 2015-10-19 NOTE — Telephone Encounter (Signed)
Pt states she has thrown up 4 times in the past 2 days. Pt thinks she needs to have her throat dilated again. Does pt need to be seen or can she have a direct procedure. Please advise.

## 2015-10-19 NOTE — Telephone Encounter (Signed)
Yes, zofran can be offered for nausea She also may choose to wait for me APP visit likely not urgent

## 2015-10-19 NOTE — Telephone Encounter (Signed)
She has history of duodenal stricture and esophageal stricture Given the complexity and recent dilation, I recommend office follow-up

## 2015-12-01 ENCOUNTER — Encounter: Payer: Self-pay | Admitting: *Deleted

## 2015-12-17 ENCOUNTER — Ambulatory Visit: Payer: Self-pay | Admitting: Internal Medicine

## 2016-03-14 MED FILL — PANTOPRAZOLE SOD DR 40 MG T: 40 | 90 days supply | Qty: 90 | Fill #0

## 2016-07-24 ENCOUNTER — Telehealth: Payer: Self-pay | Admitting: Internal Medicine

## 2016-07-24 MED ORDER — PANTOPRAZOLE SODIUM 40 MG PO TBEC
40.0000 mg | DELAYED_RELEASE_TABLET | Freq: Every day | ORAL | 0 refills | Status: DC
Start: 2016-07-24 — End: 2017-04-10

## 2016-07-24 NOTE — Telephone Encounter (Signed)
Rx sent 

## 2016-10-11 ENCOUNTER — Other Ambulatory Visit: Payer: Self-pay | Admitting: Gastroenterology

## 2017-01-30 ENCOUNTER — Encounter: Payer: Self-pay | Admitting: Internal Medicine

## 2017-02-26 ENCOUNTER — Encounter: Payer: Self-pay | Admitting: *Deleted

## 2017-04-10 ENCOUNTER — Ambulatory Visit (INDEPENDENT_AMBULATORY_CARE_PROVIDER_SITE_OTHER): Payer: BLUE CROSS/BLUE SHIELD | Admitting: Internal Medicine

## 2017-04-10 ENCOUNTER — Encounter: Payer: Self-pay | Admitting: Internal Medicine

## 2017-04-10 VITALS — BP 130/90 | HR 76 | Ht 64.5 in | Wt 204.0 lb

## 2017-04-10 DIAGNOSIS — K222 Esophageal obstruction: Secondary | ICD-10-CM

## 2017-04-10 DIAGNOSIS — Z8711 Personal history of peptic ulcer disease: Secondary | ICD-10-CM

## 2017-04-10 DIAGNOSIS — R05 Cough: Secondary | ICD-10-CM | POA: Diagnosis not present

## 2017-04-10 DIAGNOSIS — F419 Anxiety disorder, unspecified: Secondary | ICD-10-CM

## 2017-04-10 DIAGNOSIS — K219 Gastro-esophageal reflux disease without esophagitis: Secondary | ICD-10-CM | POA: Diagnosis not present

## 2017-04-10 DIAGNOSIS — R059 Cough, unspecified: Secondary | ICD-10-CM

## 2017-04-10 DIAGNOSIS — F329 Major depressive disorder, single episode, unspecified: Secondary | ICD-10-CM

## 2017-04-10 MED ORDER — PANTOPRAZOLE SODIUM 40 MG PO TBEC: 40.0000 mg | DELAYED_RELEASE_TABLET | Freq: Two times a day (BID) | ORAL | 2 refills | Status: DC

## 2017-04-10 NOTE — Progress Notes (Signed)
Patient ID: Maureen Chen, female   DOB: 1965/10/10, 51 y.o.   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 She is here alone today. She has not been seen since October 2016 at the time of her last EGD with balloon dilation.  She reports that she has been dealing with anxiety and depression since 05-28-2017her father passed away suddenly. She had been living with him for 4 years while helping take care of him. She did work for about 6 months which was a good period for her and then the contract employment ran out and she developed about 6 months of depression. His has fluctuated over the last year. She denies suicidality and homicidality but has had fluctuating moods as well as frustrations and anxiety.Her primary care provider has change of her medication and she is now on Prozac and buspirone. She is also using low dose Ativan. She is being referred to behavioral health through her primary doctor in [place].  From a GI perspective she has noticed coughing particularly at night. She is not having traditional heartburn. Dysphagia has not been an issue for her though she does chew her food well and take small bites. Previously she was having transient food impactions needing to regurgitate the food up and out. This has not happened recently. She denies odynophagia. Denies nausea and vomiting. She has dramatically reduced the use of Goody's powder. She estimates using may be 2 packets per month where she was using multiple packets daily. Bowel movements have been regular without blood or melena. She had a colonoscopy which was normal in 12/11/12.  Past Medical History:  Diagnosis Date  . Anxiety   . Depression   . Drug-seeking behavior   .  Esophageal dysmotility   . External hemorrhoids   . GI bleeding   . Herpes simplex   . Hiatal hernia   . Hypertension   . Migraine   . Peptic ulcer   . Schatzki's ring     Past Surgical History:  Procedure Laterality Date  . COLONOSCOPY    . DILATION AND CURETTAGE OF UTERUS    . ESOPHAGOGASTRODUODENOSCOPY  05/15/2012   Procedure: ESOPHAGOGASTRODUODENOSCOPY (EGD);  Surgeon: Beverley Fiedler, MD;  Location: [facility];  Service: Gastroenterology;  Laterality: N/A;  . UPPER GASTROINTESTINAL ENDOSCOPY      Outpatient Medications Prior to Visit  Medication Sig Dispense Refill  . atenolol (TENORMIN) 50 MG tablet Take 1/2 for one week and then increase to a full pill    . baclofen (LIORESAL) 10 MG tablet Take 10 mg by mouth 3 (three) times daily.    Marland Kitchen lidocaine (XYLOCAINE) 2 % solution Use .5cc-1cc TID nasally PRN    . topiramate (TOPAMAX) 100 MG tablet Take 200 mg by mouth daily.     Marland Kitchen zolpidem (AMBIEN) 5 MG tablet Take 10 mg by mouth at bedtime as needed for sleep.     . pantoprazole (PROTONIX) 40 MG tablet Take 1 tablet (40 mg total) by mouth daily. 90 tablet 0  . LORazepam (ATIVAN) 1 MG tablet Take 1 mg by mouth every 8 (eight) hours as needed for anxiety.      No facility-administered medications prior to visit.     Allergies  Allergen Reactions  . Prochlorperazine Anaphylaxis  Tolerates promethazine  . Duloxetine Hcl Other (See Comments)    Suicidal thoughts  . Benadryl [Diphenhydramine Hcl] Anxiety    Family History  Problem Relation Age of Onset  . Hypertension Father   . Cancer Mother   . Breast cancer Mother   . Colon cancer Neg Hx   . Stomach cancer Neg Hx   . Esophageal cancer Neg Hx   . Rectal cancer Neg Hx     Social History  Substance Use Topics  . Smoking status: Never Smoker  . Smokeless tobacco: Never Used  . Alcohol use Yes     Comment: rarely- mixed drink    ROS: As per history of present illness, otherwise negative  BP 130/90   Pulse 76    Ht 5' 4.5" (1.638 m)   Wt 204 lb (92.5 kg)   BMI 34.48 kg/m  Constitutional: Well-developed and well-nourished. No distress. HEENT: Normocephalic and atraumatic. Oropharynx is clear and moist. Conjunctivae are normal.  No scleral icterus. Neck: Neck supple. Trachea midline. Cardiovascular: Normal rate, regular rhythm and intact distal pulses. No M/R/G Pulmonary/chest: Effort normal and breath sounds normal. No wheezing, rales or rhonchi. Abdominal: Soft, nontender, nondistended. Bowel sounds active throughout. There are no masses palpable. No hepatosplenomegaly. Extremities: no clubbing, cyanosis, or edema Neurological: Alert and oriented to person place and time. Skin: Skin is warm and dry.  Psychiatric: Normal mood and affect. Behavior is normal.  ASSESSMENT/PLAN: 51 year old female with a past medical history of GERD, Schatzki's ring with hiatal hernia, history of peptic ulcer disease with benign duodenal stricture secondary to chronic NSAID use/abuse, hypertension, migraines, anxiety and depression who is seen for follow-up  1. GERD/Schatzki's ring/nocturnal coughing -- the nocturnal cough she is having certainly may relate to uncontrolled reflux. She is not having traditional heartburn but rather more LPR-type symptoms. We discussed GERD diet as well as avoiding eating and drinking late at night and within 2 hours of lying down. We discussed GERD trigger foods.  I have recommended that we increase pantoprazole to 40 mg twice a day before meals for 1-2 months. If symptoms of cough improved would recommend decreasing back to once daily. She is not having dysphagia at present and so will not plan repeat EGD unless symptoms warrant. She is happy with this plan  2. History of PUD/duodenal stenosis -- her duodenal stenosis has never been symptomatic. Her ulcers had healed at the time of her last endoscopy. She has ruled out for H. Pylori. Avoiding NSAIDs should prevent recurrent ulcerations in  her case and also worsening inflammation of her duodenum. She understands this and is avoiding NSAIDs.  3. CRC screening -- up-to-date with normal colonoscopy in 2014. Repeat in October 2024  4. Anxiety and depression -- being treated by primary care and they have recommended referral to psychiatry. I have strongly encouraged her to follow through with the behavioral health referral.  Annual follow-up, sooner as needed 25 minutes spent with the patient today. Greater than 50% was spent in counseling and coordination of care with the patient     ZO:XWRUEAV, Jimmye Norman, Md [address] [place], [place] [address]

## 2017-04-10 NOTE — Patient Instructions (Signed)
We have sent the following medications to your pharmacy for you to pick up at your convenience: protonix 40 mg twice daily before meals  Please avoid all NSAID's (Ibuprofen, naproxen etc)  Avoid Goody powders.  Please follow up with Dr Rhea Belton in 1 year, sooner if needed.  Call our office if you start having difficulty swallowing. You may need to have an esophageal dilation.  If you are age 50 or older, your body mass index should be between 23-30. Your Body mass index is 34.48 kg/m. If this is out of the aforementioned range listed, please consider follow up with your Primary Care Provider.  If you are age 78 or younger, your body mass index should be between 19-25. Your Body mass index is 34.48 kg/m. If this is out of the aformentioned range listed, please consider follow up with your Primary Care Provider.

## 2017-04-25 ENCOUNTER — Telehealth: Payer: Self-pay | Admitting: Internal Medicine

## 2017-04-25 NOTE — Telephone Encounter (Signed)
Pt calling wanting to schedule an EGD, states she checked with her insurance co and it would be covered. Discussed with pt that Dr. Rhea Belton wanted her to take protonix  bid for 1-2 months to see if her cough improved. Pt states her cough has not improved any. Pt has only been taking the protonix BID since 04/10/17. Pt states she will continue with the protonix if that is what dr. Rhea Belton wants to do. Please advise.

## 2017-04-25 NOTE — Telephone Encounter (Signed)
At her last visit she was not having dysphagia and thus EGD was not scheduled Would try the twice a day pantoprazole for 1 month before determining that it is not helpful with cough. Would only repeat EGD for symptomatic dysphagia/trouble swallowing, but it is good no that it will be covered by insurance when/if she needs it

## 2017-04-25 NOTE — Telephone Encounter (Signed)
Noted  

## 2017-07-23 ENCOUNTER — Encounter: Payer: Self-pay | Admitting: Internal Medicine

## 2017-07-23 DIAGNOSIS — R053 Chronic cough: Secondary | ICD-10-CM

## 2017-07-23 DIAGNOSIS — R05 Cough: Secondary | ICD-10-CM

## 2017-07-24 NOTE — Telephone Encounter (Signed)
Regarding the cough, her cough is chronic.  She certainly has reflux but on twice daily PPI acid should be suppressed.  Would recommend pulmonary referral for chronic cough  Regarding dysphagia, she has a history of dysmotility, reflux related but also an esophageal stricture.  She should focus on soft diet if financial constraints prevent repeat endoscopy.  Repeat EGD can be offered for repeat dilation when she is willing/able if dysphagia persists

## 2017-07-24 NOTE — Addendum Note (Signed)
Addended by: Annett FabianJONES, Deaaron Fulghum L on: 07/24/2017 02:29 PM   Modules accepted: Orders

## 2017-07-24 NOTE — Telephone Encounter (Signed)
Patient would like to proceed with Pulm referral.  She will wait for their consult then decide on EGD.  Referral placed she is notified that they will contact her with an appt date and time.

## 2017-08-13 ENCOUNTER — Institutional Professional Consult (permissible substitution): Payer: BLUE CROSS/BLUE SHIELD | Admitting: Pulmonary Disease

## 2017-08-30 ENCOUNTER — Ambulatory Visit (HOSPITAL_BASED_OUTPATIENT_CLINIC_OR_DEPARTMENT_OTHER)
Admission: RE | Admit: 2017-08-30 | Discharge: 2017-08-30 | Disposition: A | Discharge status: Home / Self Care | Payer: BLUE CROSS/BLUE SHIELD | Source: Ambulatory Visit | Attending: Pulmonary Disease | Admitting: Pulmonary Disease

## 2017-08-30 ENCOUNTER — Encounter: Payer: Self-pay | Admitting: Pulmonary Disease

## 2017-08-30 ENCOUNTER — Ambulatory Visit (INDEPENDENT_AMBULATORY_CARE_PROVIDER_SITE_OTHER): Payer: BLUE CROSS/BLUE SHIELD | Admitting: Pulmonary Disease

## 2017-08-30 VITALS — BP 120/78 | HR 69 | Ht 64.5 in | Wt 200.0 lb

## 2017-08-30 DIAGNOSIS — R059 Cough, unspecified: Secondary | ICD-10-CM

## 2017-08-30 DIAGNOSIS — R1310 Dysphagia, unspecified: Secondary | ICD-10-CM | POA: Diagnosis not present

## 2017-08-30 DIAGNOSIS — R05 Cough: Secondary | ICD-10-CM | POA: Diagnosis present

## 2017-08-30 DIAGNOSIS — R1319 Other dysphagia: Secondary | ICD-10-CM

## 2017-08-30 DIAGNOSIS — R1013 Epigastric pain: Secondary | ICD-10-CM | POA: Diagnosis not present

## 2017-08-30 NOTE — Patient Instructions (Signed)
Cough: We will get a chest x-ray We will get a lung function test We will check a test called and exhaled nitric oxide test I am going to go ahead and have you take over-the-counter generic cetirizine 10 mg daily to see if this helps with the cough If the cough is not improved by the next visit we will arrange for an ear nose and throat visit for laryngoscopy  Keep in mind that Topamax has a 2-7% incidence of associated cough.  I don't recommend stopping it at this time but we will need to consider this moving forward.  We will see you back in 4 weeks or sooner if needed

## 2017-08-30 NOTE — Progress Notes (Signed)
Subjective:   PATIENT ID: Maureen Chen GENDER: female DOB: 03/15/1966, MRN: [id number]  Synopsis: Referred in February 2019 for cough  HPI  Chief Complaint  Patient presents with  . Advice Only    referred by Dr. Rhea BeltonPyrtle for chronic cough X1 year    Maureen Chen is here to see me today for evaluation of cough.  She says that the cough has been present for more than a year at this point and does not seem to be going away.  She says that it was worse around November when she had a cold and she said the cough lasted much longer than she would typically expect for symptoms at that time.  She says she has not been able to pinpoint recurrent symptoms associated with the cough or specific triggers.  She says that she thinks that eating makes it worse and laying down makes it worse.  However, talking does not make it worse, laughing does not making it worse.  She talks a lot in her job and she says that this does not make the cough worse.  She says the cough is typically dry.  Sometimes she will cough out some mucus but this is rare.  She does not have associated postnasal drip.  She says that she is never really felt heartburn though she does have an extensive gastro esophageal obstructive history.  Specifically she has had a Schatzki's ring in the past which is required multiple dilations.  When this was a problem in the past she said that food would get stuck.  She says that has not been a problem for her for more than 2 years at this point.  She said she smoked for about 3 days when she was age 89 but she has not done that since then.  She has no associated shortness of breath or chest tightness.  Her weight has been stable.  She has not had a chest x-ray in the last several years.  She is never been told that she has asthma.  She denies chest tightness or wheezing.  She is never been prescribed an inhaled medicine.  She says that she is never really struggled with allergic rhinitis and she does not  feel a scratchy sensation or itching in her eyes or throat.  She has been on Topamax for at least a year, she thinks longer.  It is a medicine she uses for migraine prevention.  Past Medical History:  Diagnosis Date  . Anxiety   . Depression   . Drug-seeking behavior   . Esophageal dysmotility   . External hemorrhoids   . GI bleeding   . Herpes simplex   . Hiatal hernia   . Hypertension   . Migraine   . Peptic ulcer   . Schatzki's ring      Family History  Problem Relation Age of Onset  . Hypertension Father   . Cancer Mother   . Breast cancer Mother   . Colon cancer Neg Hx   . Stomach cancer Neg Hx   . Esophageal cancer Neg Hx   . Rectal cancer Neg Hx      Social History   Socioeconomic History  . Marital status: Divorced    Spouse name: Not on file  . Number of children: Not on file  . Years of education: Not on file  . Highest education level: Not on file  Social Needs  . Financial resource strain: Not on file  . Food insecurity -  worry: Not on file  . Food insecurity - inability: Not on file  . Transportation needs - medical: Not on file  . Transportation needs - non-medical: Not on file  Occupational History  . Not on file  Tobacco Use  . Smoking status: Never Smoker  . Smokeless tobacco: Never Used  Substance and Sexual Activity  . Alcohol use: Yes    Comment: rarely- mixed drink  . Drug use: No  . Sexual activity: No    Birth control/protection: Injection  Other Topics Concern  . Not on file  Social History Narrative  . Not on file     Allergies  Allergen Reactions  . Prochlorperazine Anaphylaxis    Tolerates promethazine  . Duloxetine Hcl Other (See Comments)    Suicidal thoughts  . Compazine [Prochlorperazine Edisylate]     anxiety  . Benadryl [Diphenhydramine Hcl] Anxiety     Outpatient Medications Prior to Visit  Medication Sig Dispense Refill  . atenolol (TENORMIN) 50 MG tablet Take 1/2 for one week and then increase to a full  pill    . baclofen (LIORESAL) 10 MG tablet Take 10 mg by mouth as needed.     . busPIRone (BUSPAR) 5 MG tablet Take 1.5 tablets by mouth 2 (two) times daily.     Marland Kitchen FLUoxetine (PROZAC) 10 MG capsule Take 1 capsule by mouth daily.    Marland Kitchen Ketorolac Tromethamine (TORADOL IJ) Inject as directed as directed.    Marland Kitchen LORazepam (ATIVAN) 1 MG tablet Take 1 mg by mouth as needed for anxiety.    . pantoprazole (PROTONIX) 40 MG tablet Take 1 tablet (40 mg total) by mouth 2 (two) times daily before a meal. 180 tablet 2  . promethazine (PHENERGAN) 25 MG/ML injection Inject into the muscle as directed.    . SUMAtriptan (IMITREX) 100 MG tablet Take by mouth as directed.    . topiramate (TOPAMAX) 100 MG tablet Take 200 mg by mouth daily.     Marland Kitchen zolpidem (AMBIEN) 5 MG tablet Take 10 mg by mouth at bedtime as needed for sleep.     Marland Kitchen lidocaine (XYLOCAINE) 2 % solution Use .5cc-1cc TID nasally PRN     No facility-administered medications prior to visit.     Review of Systems  Constitutional: Negative for fever and weight loss.  HENT: Negative for congestion, ear pain, nosebleeds and sore throat.   Eyes: Negative for redness.  Respiratory: Positive for cough, sputum production and shortness of breath. Negative for wheezing.   Cardiovascular: Negative for palpitations, leg swelling and PND.  Gastrointestinal: Negative for nausea and vomiting.  Genitourinary: Negative for dysuria.  Skin: Negative for rash.  Neurological: Negative for headaches.  Endo/Heme/Allergies: Does not bruise/bleed easily.  Psychiatric/Behavioral: Negative for depression. The patient is not nervous/anxious.       Objective:  Physical Exam   Vitals:   08/30/17 1611  BP: 120/78  Pulse: 69  SpO2: 99%  Weight: 200 lb (90.7 kg)  Height: 5' 4.5" (1.638 m)    Gen: well appearing, no acute distress HENT: NCAT, OP clear, neck supple without masses Eyes: PERRL, EOMi Lymph: no cervical lymphadenopathy PULM: CTA B CV: RRR, no mgr, no  JVD GI: BS+, soft, nontender, no hsm Derm: no rash or skin breakdown MSK: normal bulk and tone Neuro: A&Ox4, CN II-XII intact, strength 5/5 in all 4 extremities Psyche: normal mood and affect   CBC    Component Value Date/Time   WBC 13.9 (H) 04/14/2012 1306   RBC 4.55  04/14/2012 1306   HGB 12.7 04/14/2012 1306   HCT 38.6 04/14/2012 1306   PLT 459 (H) 04/14/2012 1306   MCV 84.8 04/14/2012 1306   MCH 27.9 04/14/2012 1306   MCHC 32.9 04/14/2012 1306   RDW 14.7 04/14/2012 1306   LYMPHSABS 2.8 04/14/2012 1306   MONOABS 1.2 (H) 04/14/2012 1306   EOSABS 0.2 04/14/2012 1306   BASOSABS 0.1 04/14/2012 1306     Chest imaging:  PFT:  Labs:  Path:  Echo:  Heart Catheterization:   Records from her September 2018 visit with GI medicine reviewed where they increased her pantoprazole to twice a day, the impression at that time was that she had laryngopharyngeal reflux symptoms.    Assessment & Plan:   Cough - Plan: DG Chest 2 View, Pulmonary function test  Dyspepsia  Esophageal dysphagia  Discussion: Maureen Landsberg presents with cough which is been going on for at least a year with no other associated pulmonary problems.  It sounds as if the cough is predominantly dry and likely due to laryngeal irritation.  The differential diagnosis of chronic cough is broad and includes postnasal drip, acid reflux, less likely underlying lung diseases like asthma or pulmonary parenchymal disease or ongoing cyclical cough.  Topamax carries a 2-7% reported history of cough.  It is unclear to me if this may be a cause.  In her particular case it is difficult to know what is causing this that she reports no postnasal drip symptoms, respiratory symptoms, and she says that she does not have symptoms of heartburn.  She has been compliant with antacid therapy for years.  Plan: Cough: We will get a chest x-ray We will get a lung function test We will check a test called and exhaled nitric oxide test I  am going to go ahead and have you take over-the-counter generic cetirizine 10 mg daily to see if this helps with the cough If the cough is not improved by the next visit we will arrange for an ear nose and throat visit for laryngoscopy  Keep in mind that Topamax has a 2-7% incidence of associated cough.  I don't recommend stopping it at this time but we will need to consider this moving forward.  We will see you back in 4 weeks or sooner if needed    Current Outpatient Medications:  .  atenolol (TENORMIN) 50 MG tablet, Take 1/2 for one week and then increase to a full pill, Disp: , Rfl:  .  baclofen (LIORESAL) 10 MG tablet, Take 10 mg by mouth as needed. , Disp: , Rfl:  .  busPIRone (BUSPAR) 5 MG tablet, Take 1.5 tablets by mouth 2 (two) times daily. , Disp: , Rfl:  .  FLUoxetine (PROZAC) 10 MG capsule, Take 1 capsule by mouth daily., Disp: , Rfl:  .  Ketorolac Tromethamine (TORADOL IJ), Inject as directed as directed., Disp: , Rfl:  .  LORazepam (ATIVAN) 1 MG tablet, Take 1 mg by mouth as needed for anxiety., Disp: , Rfl:  .  pantoprazole (PROTONIX) 40 MG tablet, Take 1 tablet (40 mg total) by mouth 2 (two) times daily before a meal., Disp: 180 tablet, Rfl: 2 .  promethazine (PHENERGAN) 25 MG/ML injection, Inject into the muscle as directed., Disp: , Rfl:  .  SUMAtriptan (IMITREX) 100 MG tablet, Take by mouth as directed., Disp: , Rfl:  .  topiramate (TOPAMAX) 100 MG tablet, Take 200 mg by mouth daily. , Disp: , Rfl:  .  zolpidem (AMBIEN) 5 MG  tablet, Take 10 mg by mouth at bedtime as needed for sleep. , Disp: , Rfl:

## 2017-08-31 ENCOUNTER — Telehealth: Payer: Self-pay | Admitting: Pulmonary Disease

## 2017-08-31 NOTE — Telephone Encounter (Signed)
Called and spoke with patient, she is aware of results, nothing further needed.

## 2017-09-04 ENCOUNTER — Ambulatory Visit (INDEPENDENT_AMBULATORY_CARE_PROVIDER_SITE_OTHER): Payer: BLUE CROSS/BLUE SHIELD | Admitting: Pulmonary Disease

## 2017-09-04 DIAGNOSIS — R059 Cough, unspecified: Secondary | ICD-10-CM

## 2017-09-04 DIAGNOSIS — R05 Cough: Secondary | ICD-10-CM

## 2017-09-04 LAB — PULMONARY FUNCTION TEST
DL/VA % pred: 174 %
DL/VA: 5.52 ml/min/mmHg/L
DLCO UNC % PRED: 218 %
DLCO UNC: 25.11 ml/min/mmHg
FEF 25-75 PRE: 3.75 L/s
FEF 25-75 Post: 3.91 L/sec
FEF2575-%CHANGE-POST: 4 %
FEF2575-%PRED-POST: 176 %
FEF2575-%PRED-PRE: 169 %
FEV1-%Change-Post: 1 %
FEV1-%PRED-POST: 133 %
FEV1-%Pred-Pre: 131 %
FEV1-Post: 2.6 L
FEV1-Pre: 2.56 L
FEV1FVC-%CHANGE-POST: 1 %
FEV1FVC-%Pred-Pre: 108 %
FEV6-%CHANGE-POST: 0 %
FEV6-%Pred-Post: 123 %
FEV6-%Pred-Pre: 123 %
FEV6-Post: 2.95 L
FEV6-Pre: 2.95 L
FEV6FVC-%CHANGE-POST: 0 %
FEV6FVC-%Pred-Post: 101 %
FEV6FVC-%Pred-Pre: 102 %
FVC-%CHANGE-POST: 0 %
FVC-%PRED-POST: 121 %
FVC-%Pred-Pre: 120 %
FVC-Post: 2.95 L
FVC-Pre: 2.95 L
POST FEV1/FVC RATIO: 88 %
PRE FEV1/FVC RATIO: 87 %
Post FEV6/FVC ratio: 100 %
Pre FEV6/FVC Ratio: 100 %
RV % pred: 149 %
RV: 1.99 L
TLC % pred: 142 %
TLC: 5.19 L

## 2017-09-04 LAB — NITRIC OXIDE: Nitric Oxide: 14

## 2017-09-04 NOTE — Addendum Note (Signed)
Addended by: Jaynee EaglesLEMONS, LINDSAY C on: 09/04/2017 04:40 PM   Modules accepted: Orders

## 2017-09-04 NOTE — Progress Notes (Signed)
PFT completed today.  

## 2017-09-27 ENCOUNTER — Ambulatory Visit: Payer: BLUE CROSS/BLUE SHIELD | Admitting: Pulmonary Disease

## 2017-09-27 ENCOUNTER — Encounter: Payer: Self-pay | Admitting: Pulmonary Disease

## 2017-09-27 VITALS — BP 124/83 | HR 78 | Ht 64.5 in | Wt 202.0 lb

## 2017-09-27 DIAGNOSIS — R1319 Other dysphagia: Secondary | ICD-10-CM

## 2017-09-27 DIAGNOSIS — R1310 Dysphagia, unspecified: Secondary | ICD-10-CM

## 2017-09-27 DIAGNOSIS — R05 Cough: Secondary | ICD-10-CM | POA: Diagnosis not present

## 2017-09-27 DIAGNOSIS — R059 Cough, unspecified: Secondary | ICD-10-CM

## 2017-09-27 MED ORDER — HYDROCOD POLST-CPM POLST ER 10-8 MG/5ML PO SUER: 5.0000 mL | Freq: Two times a day (BID) | ORAL | 0 refills | Status: DC | PRN

## 2017-09-27 NOTE — Patient Instructions (Signed)
Cough: Take Tussionex every day for 3 days, every 12 hours Do not take the Tussionex and drive and do not take it with other sedating medicines You need to try to suppress your cough to allow your larynx (voice box) to heal.  For three days don't talk, laugh, sing, or clear your throat. Do everything you can to suppress the cough during this time. Use hard candies (sugarless Jolly Ranchers) or non-mint or non-menthol containing cough drops during this time to soothe your throat.  Use a cough suppressant (Delsym or what I have prescribed you) around the clock during this time.  After three days, gradually increase the use of your voice and back off on the cough suppressants.  If you continues to cough despite this intervention then you needs to be seen by ear nose and throat to have a laryngoscopy  We will see you back in 3-4 weeks or sooner if needed

## 2017-09-27 NOTE — Progress Notes (Signed)
Subjective:   PATIENT ID: Maureen Chen GENDER: female DOB: 12-03-1965, MRN: [id number]  Synopsis: Referred in February 2019 for cough  HPI  Chief Complaint  Patient presents with  . Follow-up    review PFT.  pt's cough unchanged on zyrtec.     She says that she took Zyrtec every day for 2 weeks and it made no difference in her cough.  She denies any shortness of breath.  She says that when she took her pulmonary function test she had a lot of cough that day.  She says that sometimes the cough will not bother her for 3 or 4 hours but then she will change positions and suddenly she will cough severely repeatedly.  She notes that she talks frequently.  She has not tried voice rest since the last visit.  Past Medical History:  Diagnosis Date  . Anxiety   . Depression   . Drug-seeking behavior   . Esophageal dysmotility   . External hemorrhoids   . GI bleeding   . Herpes simplex   . Hiatal hernia   . Hypertension   . Migraine   . Peptic ulcer   . Schatzki's ring       Review of Systems  Constitutional: Negative for fever and weight loss.  HENT: Negative for congestion, ear pain, nosebleeds and sore throat.   Eyes: Negative for redness.  Respiratory: Positive for cough, sputum production and shortness of breath. Negative for wheezing.   Cardiovascular: Negative for palpitations, leg swelling and PND.  Gastrointestinal: Negative for nausea and vomiting.  Genitourinary: Negative for dysuria.  Skin: Negative for rash.  Neurological: Negative for headaches.  Endo/Heme/Allergies: Does not bruise/bleed easily.  Psychiatric/Behavioral: Negative for depression. The patient is not nervous/anxious.       Objective:  Physical Exam   Vitals:   09/27/17 1601  BP: 124/83  Pulse: 78  SpO2: 98%  Weight: 202 lb (91.6 kg)  Height: 5' 4.5" (1.638 m)    Gen: well appearing HENT: OP clear, TM's clear, neck supple PULM: CTA B, normal percussion CV: RRR, no mgr, trace  edema GI: BS+, soft, nontender Derm: no cyanosis or rash Psyche: normal mood and affect    CBC    Component Value Date/Time   WBC 13.9 (H) 04/14/2012 1306   RBC 4.55 04/14/2012 1306   HGB 12.7 04/14/2012 1306   HCT 38.6 04/14/2012 1306   PLT 459 (H) 04/14/2012 1306   MCV 84.8 04/14/2012 1306   MCH 27.9 04/14/2012 1306   MCHC 32.9 04/14/2012 1306   RDW 14.7 04/14/2012 1306   LYMPHSABS 2.8 04/14/2012 1306   MONOABS 1.2 (H) 04/14/2012 1306   EOSABS 0.2 04/14/2012 1306   BASOSABS 0.1 04/14/2012 1306     Chest imaging: February 2019 chest x-ray images independently reviewed showing normal pulmonary parenchyma no clear abnormality  PFT: February 2019 pulmonary function testing ratio 88%, FEV1 2.6 133% predicted, FVC 2.95 L 121% predicted, total lung capacity 5.19 142% predicted, DLCO 25.11 218% predicted  Labs:  Path:  Echo:  Heart Catheterization:      Assessment & Plan:   Cough  Esophageal dysphagia  Discussion: Maureen Chen continues to cough despite taking the Zyrtec.  She has not tried voice rest.  Lung function testing is normal and chest x-ray is normal and her physical exam is normal so I see no evidence of an underlying lung disease causing the cough.  I believe the cough is due to persistent laryngeal irritation and  she needs to rest her voice.  She may ultimately need to have in laryngoscopy.  I think the best approach moving forward is to allow her vocal cord inflammation and irritation to heal up with aggressive voice rest and cough suppression.  We will prescribe Tussionex for her to use during a 3-day period to help suppress the cough.  Plan: Cough: Take Tussionex every day for 3 days, every 12 hours Do not take the Tussionex and drive and do not take it with other sedating medicines You need to try to suppress your cough to allow your larynx (voice box) to heal.  For three days don't talk, laugh, sing, or clear your throat. Do everything you can to suppress  the cough during this time. Use hard candies (sugarless Jolly Ranchers) or non-mint or non-menthol containing cough drops during this time to soothe your throat.  Use a cough suppressant (Delsym or what I have prescribed you) around the clock during this time.  After three days, gradually increase the use of your voice and back off on the cough suppressants.  If you continues to cough despite this intervention then you needs to be seen by ear nose and throat to have a laryngoscopy  We will see you back in 3-4 weeks or sooner if needed    Current Outpatient Medications:  .  atenolol (TENORMIN) 50 MG tablet, Take 1/2 for one week and then increase to a full pill, Disp: , Rfl:  .  baclofen (LIORESAL) 10 MG tablet, Take 10 mg by mouth as needed. , Disp: , Rfl:  .  busPIRone (BUSPAR) 5 MG tablet, Take 1.5 tablets by mouth 2 (two) times daily. , Disp: , Rfl:  .  FLUoxetine (PROZAC) 10 MG capsule, Take 1 capsule by mouth daily., Disp: , Rfl:  .  Ketorolac Tromethamine (TORADOL IJ), Inject as directed as directed., Disp: , Rfl:  .  LORazepam (ATIVAN) 1 MG tablet, Take 1 mg by mouth as needed for anxiety., Disp: , Rfl:  .  pantoprazole (PROTONIX) 40 MG tablet, Take 1 tablet (40 mg total) by mouth 2 (two) times daily before a meal., Disp: 180 tablet, Rfl: 2 .  promethazine (PHENERGAN) 25 MG/ML injection, Inject into the muscle as directed., Disp: , Rfl:  .  SUMAtriptan (IMITREX) 100 MG tablet, Take by mouth as directed., Disp: , Rfl:  .  topiramate (TOPAMAX) 100 MG tablet, Take 200 mg by mouth daily. , Disp: , Rfl:  .  zolpidem (AMBIEN) 5 MG tablet, Take 10 mg by mouth at bedtime as needed for sleep. , Disp: , Rfl:  .  chlorpheniramine-HYDROcodone (TUSSIONEX PENNKINETIC ER) 10-8 MG/5ML SUER, Take 5 mLs by mouth every 12 (twelve) hours as needed for cough., Disp: 140 mL, Rfl: 0

## 2017-09-28 ENCOUNTER — Telehealth: Payer: Self-pay | Admitting: Pulmonary Disease

## 2017-09-28 NOTE — Telephone Encounter (Signed)
Noted  

## 2017-10-01 ENCOUNTER — Encounter: Payer: Self-pay | Admitting: Pulmonary Disease

## 2017-10-01 NOTE — Telephone Encounter (Signed)
Dr Kendrick FriesMcQuaid,  Please see email from pt and advise recs, thanks!  Good morning...well I survived the weekend and my 3 days of NOT TALKING.The pharmacist was only allowed to fill 70mls of my Tussionex cough medicine and she told me that it was for a 6-7 days worth.I took it for the 3 days that you instructed and I only coughed about twice.But I woke up Sunday and Monday with a little bit of a sore throat.Once I got to work and started talking some.Marland Kitchen.Marland Kitchen.I could feel my voice starting to become a little scratchy.I am drinking water now and it feels good.Do I need to keep taking the rest of the Tussionex until the rest of this gone or what is my next step???  Thanks.Maureen SalmonRenee' Chen [phone]

## 2017-10-01 NOTE — Telephone Encounter (Signed)
Pt sent a second message through mychart:   2nd part:  Also...just wanted you to know that the Tussionex cough medicine DID NOT make me sleepy.I was able to take it at work and still be fine.I took it every 12 hours at the 9:00 time frame.  Just like I said in the previous e-mail.Marland Kitchen.Marland Kitchen.Do I need to take the rest of the Tussionex for the next 3 days or so and since you wrote the Rx for 140mls can I get the remainder filled and take it when necessary?Basically what is the next step...because I feel like my throat is really scratchy whenever I talk.And like I said...the cough medicine helped me not cough for the 12 hours...but I can't take that for the rest of my life...  Thanks. Maureen LowesLaura ReneeHart [place]' [place] [phone]

## 2017-10-09 ENCOUNTER — Encounter: Payer: Self-pay | Admitting: Pulmonary Disease

## 2017-10-10 ENCOUNTER — Encounter: Payer: Self-pay | Admitting: Pulmonary Disease

## 2017-10-14 ENCOUNTER — Other Ambulatory Visit: Payer: Self-pay | Admitting: Pulmonary Disease

## 2017-10-15 ENCOUNTER — Encounter: Payer: Self-pay | Admitting: Pulmonary Disease

## 2017-10-15 NOTE — Telephone Encounter (Signed)
BQ please advise on refill. Thanks! 

## 2017-10-16 ENCOUNTER — Other Ambulatory Visit: Payer: Self-pay

## 2017-10-16 DIAGNOSIS — R490 Dysphonia: Secondary | ICD-10-CM

## 2017-10-16 NOTE — Telephone Encounter (Signed)
BQ please advise if pt should keep her scheduled apt for 10/25/17. Referral to ENT has been placed.

## 2017-10-25 ENCOUNTER — Ambulatory Visit: Payer: BLUE CROSS/BLUE SHIELD | Admitting: Pulmonary Disease

## 2017-11-01 MED ORDER — HYDROCOD POLST-CPM POLST ER 10-8 MG/5ML PO SUER: 5.0000 mL | Freq: Two times a day (BID) | ORAL | 0 refills | Status: AC | PRN

## 2017-11-01 MED ORDER — HYDROCOD POLST-CPM POLST ER 10-8 MG/5ML PO SUER: 5.0000 mL | Freq: Two times a day (BID) | ORAL | 0 refills | Status: DC | PRN

## 2017-11-01 NOTE — Telephone Encounter (Signed)
yes

## 2017-11-12 ENCOUNTER — Encounter: Payer: Self-pay | Admitting: Internal Medicine

## 2017-11-13 ENCOUNTER — Other Ambulatory Visit: Payer: Self-pay

## 2017-11-13 NOTE — Telephone Encounter (Signed)
Let patient know we need to determine if her cough is truly related to acid reflux or not I would have her continue pantoprazole 40 mg twice daily AC Schedule her for 24-hour pH and impedance with esophageal manometry --this will actually monitor her reflux episodes and we can correlate cough with reflux versus cough not caused by reflux.  This will guide treatment decisions

## 2017-11-26 ENCOUNTER — Encounter (HOSPITAL_COMMUNITY)
Admission: RE | Disposition: A | Discharge status: Home / Self Care | Payer: Self-pay | Source: Ambulatory Visit | Attending: Gastroenterology

## 2017-11-26 ENCOUNTER — Ambulatory Visit (HOSPITAL_COMMUNITY)
Admission: RE | Admit: 2017-11-26 | Discharge: 2017-11-26 | Disposition: A | Discharge status: Home / Self Care | Payer: BLUE CROSS/BLUE SHIELD | Source: Ambulatory Visit | Attending: Gastroenterology | Admitting: Gastroenterology

## 2017-11-26 DIAGNOSIS — K449 Diaphragmatic hernia without obstruction or gangrene: Secondary | ICD-10-CM | POA: Insufficient documentation

## 2017-11-26 DIAGNOSIS — R059 Cough, unspecified: Secondary | ICD-10-CM

## 2017-11-26 DIAGNOSIS — R05 Cough: Secondary | ICD-10-CM | POA: Insufficient documentation

## 2017-11-26 DIAGNOSIS — K219 Gastro-esophageal reflux disease without esophagitis: Secondary | ICD-10-CM

## 2017-11-26 HISTORY — PX: 24 HOUR PH STUDY: SHX5419

## 2017-11-26 HISTORY — PX: PH IMPEDANCE STUDY: SHX5565

## 2017-11-26 HISTORY — PX: ESOPHAGEAL MANOMETRY: SHX5429

## 2017-11-26 SURGERY — IMPEDANCE PH STUDY, ESOPHAGUS
Anesthesia: LOCAL

## 2017-11-26 MED ORDER — LIDOCAINE VISCOUS 2 % MT SOLN
OROMUCOSAL | Status: AC
  Filled 2017-11-26: qty 15

## 2017-11-26 SURGICAL SUPPLY — 2 items
FACESHIELD LNG OPTICON STERILE (SAFETY) IMPLANT
GLOVE BIO SURGEON STRL SZ8 (GLOVE) ×4 IMPLANT

## 2017-11-26 NOTE — Progress Notes (Signed)
Esophageal Manometry done per protocol. Pt tolerated well without distress or complication.  Ph probe with impedance placed per manometry measurements and protocol.. Pt tolerated well. Went over teaching with pt regarding PH study and monitor using teach back. Questions answered. Pt will return tomorrow 9:40 or after to have probe removed and monitor downloaded. Study will be sent to dr. Sherald Barge office.

## 2017-11-27 ENCOUNTER — Encounter (HOSPITAL_COMMUNITY): Payer: Self-pay | Admitting: Gastroenterology

## 2017-12-04 ENCOUNTER — Encounter: Payer: Self-pay | Admitting: Internal Medicine

## 2017-12-05 NOTE — Telephone Encounter (Signed)
Please let patient know that her esophageal manometry and pH study showed that her esophagus motility is normal Her reflux is completely controlled with her current medication, pantoprazole With acid reflux completely controlled (as this study proves) this should not be the primary driver for her cough I would recommend that she further discuss management of her chronic cough with Dr. Kendrick Fries, her pulmonologist

## 2017-12-06 ENCOUNTER — Encounter: Payer: Self-pay | Admitting: Pulmonary Disease

## 2017-12-07 ENCOUNTER — Encounter: Payer: Self-pay | Admitting: Pulmonary Disease

## 2017-12-07 ENCOUNTER — Other Ambulatory Visit: Payer: Self-pay | Admitting: Pulmonary Disease

## 2017-12-11 DIAGNOSIS — R059 Cough, unspecified: Secondary | ICD-10-CM

## 2017-12-11 DIAGNOSIS — R05 Cough: Secondary | ICD-10-CM

## 2017-12-11 DIAGNOSIS — K219 Gastro-esophageal reflux disease without esophagitis: Secondary | ICD-10-CM

## 2017-12-11 NOTE — Telephone Encounter (Signed)
BQ please advise on tussionex refill request.  Thanks!

## 2017-12-12 NOTE — Telephone Encounter (Signed)
Patient is scheduled to see BQ in the [place] office on 6.4.19 Will sign off

## 2017-12-18 ENCOUNTER — Encounter: Payer: Self-pay | Admitting: Pulmonary Disease

## 2017-12-18 ENCOUNTER — Ambulatory Visit: Payer: BLUE CROSS/BLUE SHIELD | Admitting: Pulmonary Disease

## 2017-12-18 VITALS — BP 132/84 | HR 56 | Ht 64.5 in | Wt 196.0 lb

## 2017-12-18 DIAGNOSIS — Z765 Malingerer [conscious simulation]: Secondary | ICD-10-CM | POA: Diagnosis not present

## 2017-12-18 DIAGNOSIS — R490 Dysphonia: Secondary | ICD-10-CM

## 2017-12-18 DIAGNOSIS — R05 Cough: Secondary | ICD-10-CM

## 2017-12-18 DIAGNOSIS — R059 Cough, unspecified: Secondary | ICD-10-CM

## 2017-12-18 NOTE — Progress Notes (Signed)
Subjective:   PATIENT ID: Maureen Chen GENDER: female DOB: [birth year], MRN: [id number]  Synopsis: Referred in February 2019 for cough.  She had a 24 hour pH probe  HPI  Chief Complaint  Patient presents with  . Follow-up    Cough- hasn't improved    Since the last visit [name] went to go see ear nose and throat who said that she had irritation of her vocal cords and recommended that she go see gastroenterology.  She went to GI and she had a 24-hour manometry and pH probe which showed that her acid reflux is well controlled.  She was asked to come back to see me for the same.  [name] says that she is still coughing and her symptoms haven't improved.  She says that in the last two months she went to [facility] and had a laryngoscopy and she was told she doesn't have any sinus problems.    Past Medical History:  Diagnosis Date  . Anxiety   . Depression   . Drug-seeking behavior   . Esophageal dysmotility   . External hemorrhoids   . GI bleeding   . Herpes simplex   . Hiatal hernia   . Hypertension   . Migraine   . Peptic ulcer   . Schatzki's ring       Review of Systems  Constitutional: Negative for fever and weight loss.  HENT: Negative for congestion, ear pain, nosebleeds and sore throat.   Eyes: Negative for redness.  Respiratory: Positive for cough, sputum production and shortness of breath. Negative for wheezing.   Cardiovascular: Negative for palpitations, leg swelling and PND.  Gastrointestinal: Negative for nausea and vomiting.  Genitourinary: Negative for dysuria.  Skin: Negative for rash.  Neurological: Negative for headaches.  Endo/Heme/Allergies: Does not bruise/bleed easily.  Psychiatric/Behavioral: Negative for depression. The patient is not nervous/anxious.       Objective:  Physical Exam   Vitals:   12/18/17 1154  BP: 132/84  Pulse: (!) 56  SpO2: 92%  Weight: 196 lb (88.9 kg)  Height: 5' 4.5" (1.638 m)     Gen: well appearing HENT: OP clear, TM's clear, neck supple PULM: CTA B, normal percussion CV: RRR, no mgr, trace edema GI: BS+, soft, nontender Derm: no cyanosis or rash Psyche: normal mood and affect     CBC    Component Value Date/Time   WBC 13.9 (H) 04/14/2012 1306   RBC 4.55 04/14/2012 1306   HGB 12.7 04/14/2012 1306   HCT 38.6 04/14/2012 1306   PLT 459 (H) 04/14/2012 1306   MCV 84.8 04/14/2012 1306   MCH 27.9 04/14/2012 1306   MCHC 32.9 04/14/2012 1306   RDW 14.7 04/14/2012 1306   LYMPHSABS 2.8 04/14/2012 1306   MONOABS 1.2 (H) 04/14/2012 1306   EOSABS 0.2 04/14/2012 1306   BASOSABS 0.1 04/14/2012 1306     Chest imaging: February 2019 chest x-ray images independently reviewed showing normal pulmonary parenchyma no clear abnormality  PFT: February 2019 pulmonary function testing ratio 88%, FEV1 2.6 133% predicted, FVC 2.95 L 121% predicted, total lung capacity 5.19 142% predicted, DLCO 25.11 218% predicted  Labs:  Path:  Echo:  Heart Catheterization:      Assessment & Plan:   Hoarseness of voice  Cough  Drug-seeking behavior  Discussion: [name] returns to clinic today asking for narcotics.  Specifically she is asking for Tussionex.  In fact at the beginning of our visit I made it clear  to her that I was not going to prescribe narcotics for her chronic cough.  Despite this she waited for me while I was seeing another patient so that I could come back and talk to her again.  During the second visit she asked for narcotics again.  I told her that this was alarming behavior and I would not be prescribing narcotics for a chronic condition.  Since I met her and she came complaining for cough we look for evidence of an underlying lung disease.  A chest x-ray and lung function test showed that she did not have an underlying lung problem causing the cough.  I recommended voice rest which she tried but admittedly did not fully complete.  I recommended that she  see ear nose and throat.  ENT saw laryngeal irritation and felt that this may be due to either sinus disease and recommended a CT of her sinuses (she refused) and that she be seen by gastroenterology.  When she saw gastroenterology she had a 24-hour pH and manometry probe which was within normal limits.  So she has laryngeal irritation which is driving her cough.  We could either try to treat this with medications which suppress the cough reflex (like Elavil) or she could consider speech therapy to help with this.  In addition, I have told her multiple times that Topamax carries an 8 to 10% incidence of cough and I have recommended that this medicine be stopped.  She is reluctant to do this.  Cough I worry that this is due to Topamax.  You need to talk to your neurology doctor about whether or not we can stop this medicine for at least 2 months. If you still have the cough despite stopping the Topamax or if you cannot stop the Topamax, then we need to consider treating you with a medication like Elavil or gabapentin to reduce the abnormal sensation coming from your vocal cords. An alternative may be to talk to a speech therapist about ways you can reduce the inflammation and irritation on your vocal cords.  We will see you back after you have talk to your neurology physician  > 50% of this 40 minute visit spent face to face    Current Outpatient Medications:  .  atenolol (TENORMIN) 50 MG tablet, Take 1/2 for one week and then increase to a full pill, Disp: , Rfl:  .  baclofen (LIORESAL) 10 MG tablet, Take 10 mg by mouth as needed. , Disp: , Rfl:  .  busPIRone (BUSPAR) 5 MG tablet, Take 1.5 tablets by mouth 2 (two) times daily. , Disp: , Rfl:  .  chlorpheniramine-HYDROcodone (TUSSIONEX PENNKINETIC ER) 10-8 MG/5ML SUER, Take 5 mLs by mouth every 12 (twelve) hours as needed for cough., Disp: 140 mL, Rfl: 0 .  FLUoxetine (PROZAC) 10 MG capsule, Take 1 capsule by mouth daily., Disp: , Rfl:  .   Ketorolac Tromethamine (TORADOL IJ), Inject as directed as directed., Disp: , Rfl:  .  LORazepam (ATIVAN) 1 MG tablet, Take 1 mg by mouth as needed for anxiety., Disp: , Rfl:  .  pantoprazole (PROTONIX) 40 MG tablet, Take 1 tablet (40 mg total) by mouth 2 (two) times daily before a meal., Disp: 180 tablet, Rfl: 2 .  promethazine (PHENERGAN) 25 MG/ML injection, Inject into the muscle as directed., Disp: , Rfl:  .  SUMAtriptan (IMITREX) 100 MG tablet, Take by mouth as directed., Disp: , Rfl:  .  topiramate (TOPAMAX) 100 MG tablet, Take 200 mg  by mouth daily. , Disp: , Rfl:  .  zolpidem (AMBIEN) 5 MG tablet, Take 10 mg by mouth at bedtime as needed for sleep. , Disp: , Rfl:

## 2017-12-18 NOTE — Patient Instructions (Signed)
Cough I worry that this is due to Topamax.  You need to talk to your neurology doctor about whether or not we can stop this medicine for at least 2 months. If you still have the cough despite stopping the Topamax or if you cannot stop the Topamax, then we need to consider treating you with a medication like Elavil or gabapentin to reduce the abnormal sensation coming from your vocal cords. An alternative may be to talk to a speech therapist about ways you can reduce the inflammation and irritation on your vocal cords.  We will see you back after you have talk to your neurology physician

## 2017-12-19 ENCOUNTER — Encounter: Payer: Self-pay | Admitting: Pulmonary Disease

## 2017-12-19 ENCOUNTER — Encounter: Payer: Self-pay | Admitting: Internal Medicine

## 2017-12-19 NOTE — Telephone Encounter (Signed)
Upset that with the interaction she had yesterday with Dr. Kendrick FriesMcQuaid. Felt her position was "taken out of context".   She reports she is not taking nor hooked on narcotics.  She does not use narcotics for her migraines. Has used Tussinex in the past for cough.  She did not realize she needed a written Rx, she had previously requested refill via pharmacy. She states the multiple previous requests for this medication, but never got this filled.  Worried it looks bad on her records, which is upsetting to her.  States she had communicated with neurology and has plans to change out Topamax to see if cough improves.    I encouraged her to continue with recommended plan of care.  Continue trial off of Topamax and follow-up with Dr. Kendrick FriesMcQuaid as he recommended. There are other options if Topamax discontinuation does not improve cough.  She thanked me for the call.

## 2018-03-04 ENCOUNTER — Other Ambulatory Visit: Payer: Self-pay | Admitting: Internal Medicine

## 2018-04-03 IMAGING — DX DG CHEST 2V
2 series · 2 of 2 positions shown · non-contrast
Comparison: None.

CLINICAL DATA: Cough x1 year

EXAM:
CHEST  2 VIEW

[chest pa]
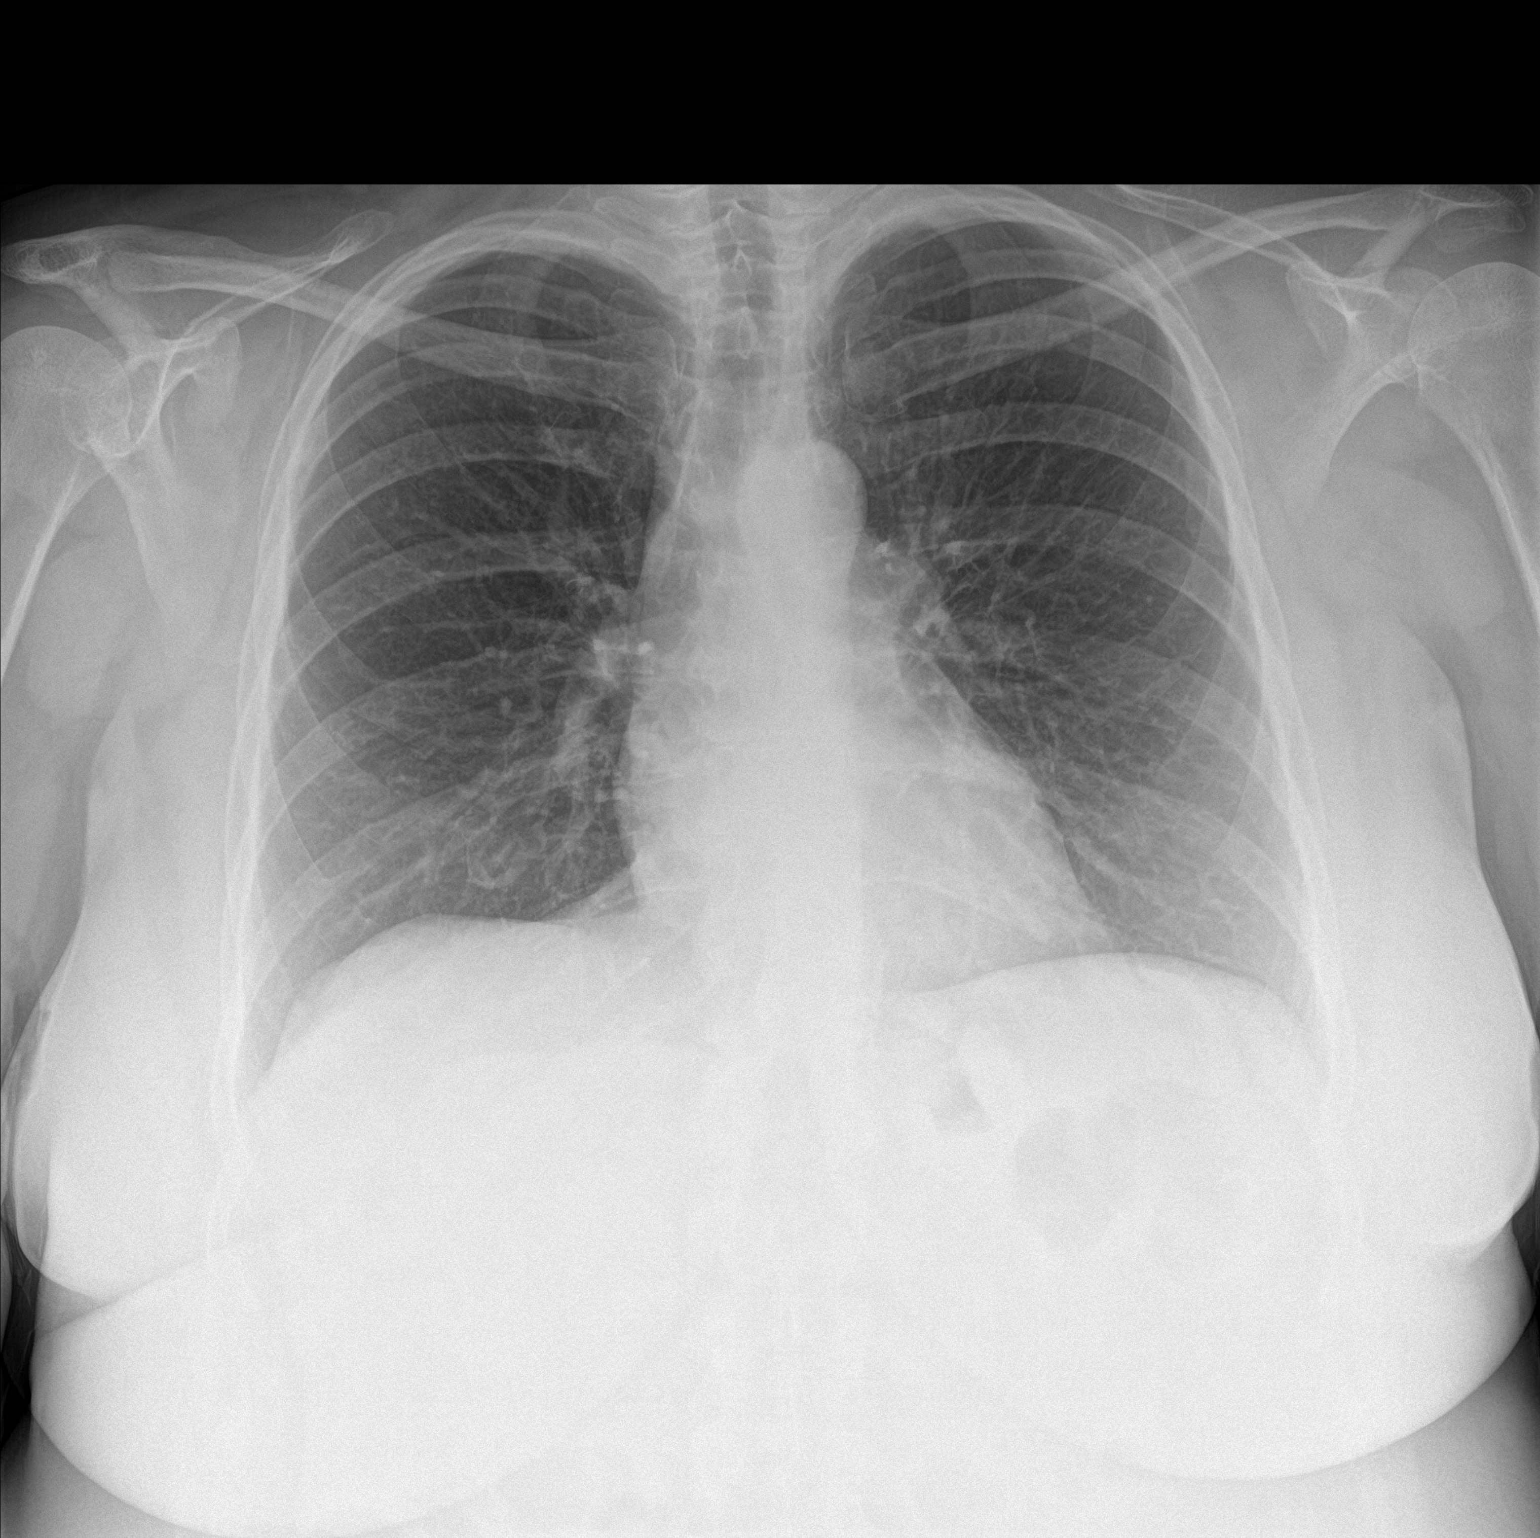

[chest lat]
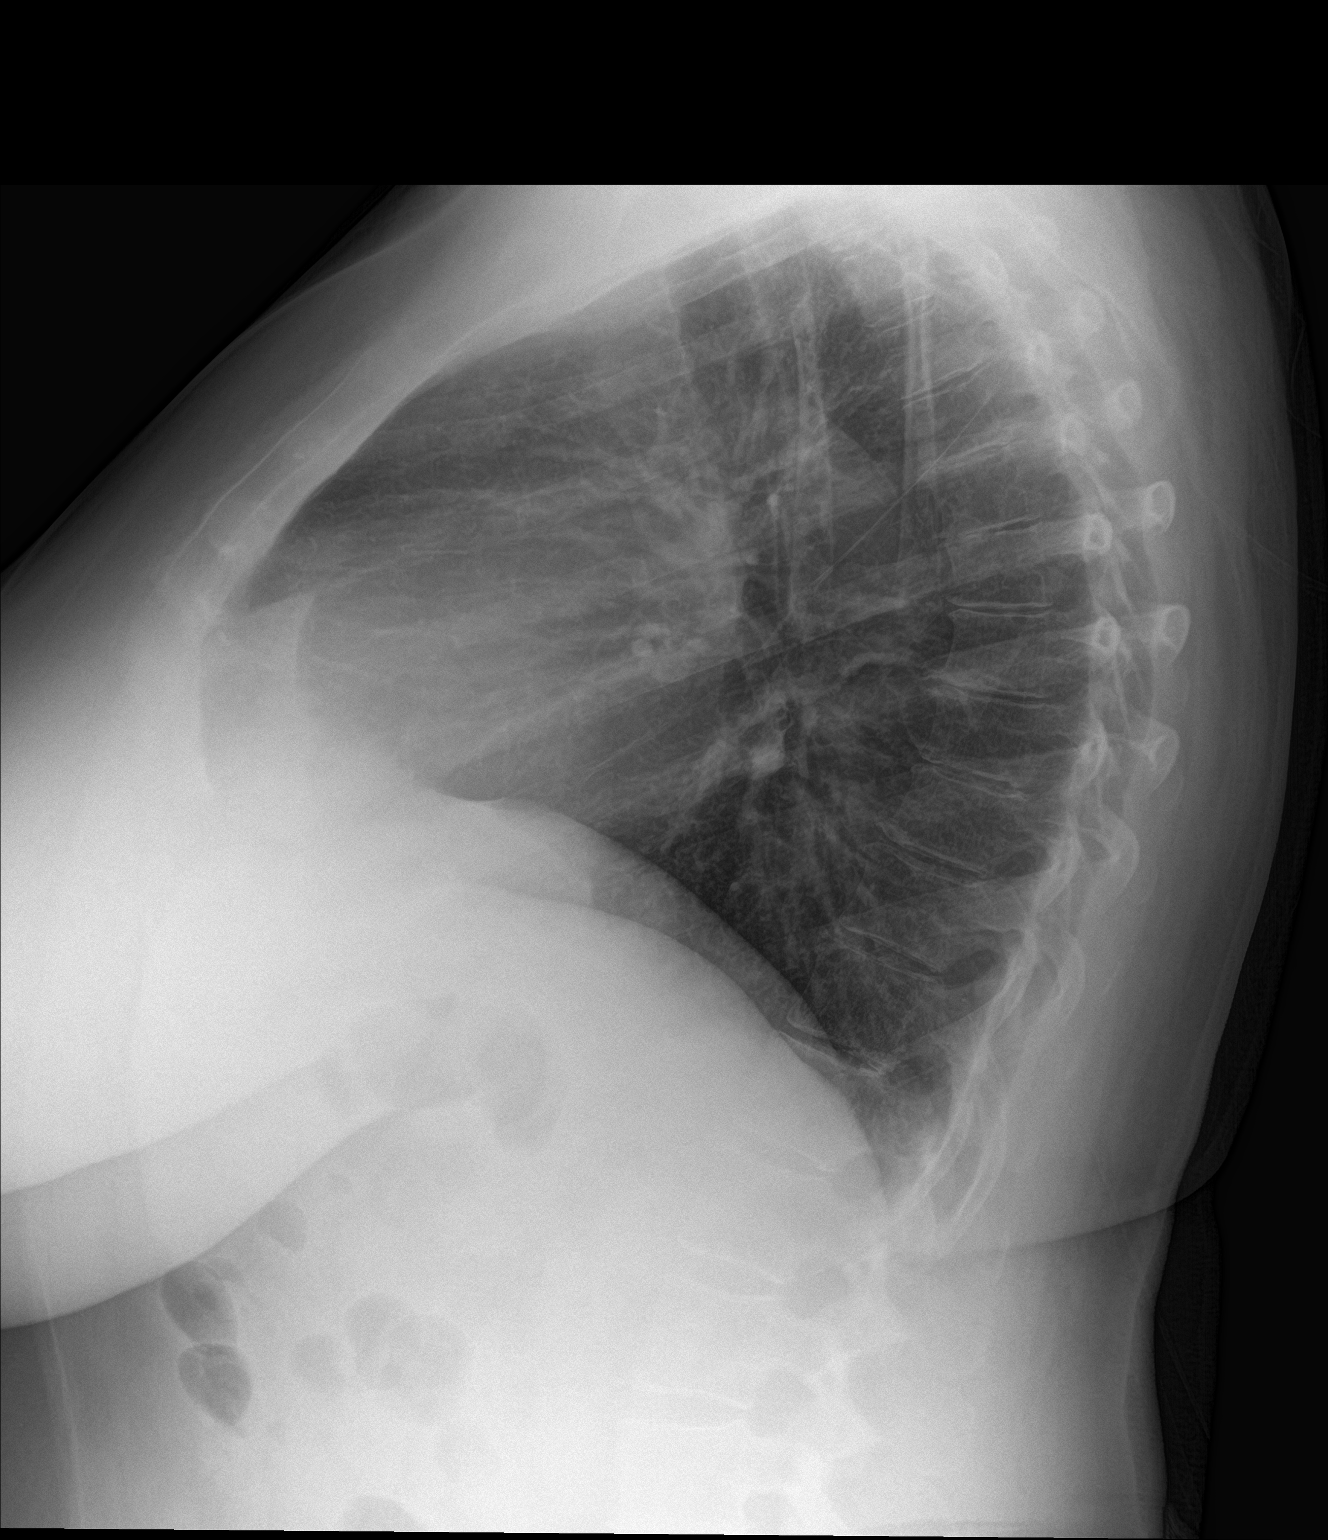

[2 of 2 positions shown; findings below may reference images not displayed]

FINDINGS: The heart size and mediastinal contours are within normal limits.
Both lungs are clear. The visualized skeletal structures are
unremarkable.
IMPRESSION: No active cardiopulmonary disease.

## 2019-04-10 ENCOUNTER — Telehealth: Payer: Self-pay | Admitting: Internal Medicine

## 2019-04-10 NOTE — Telephone Encounter (Signed)
Spoke with pt and she states she has been having problems with dysphagia for about a month. Requesting to have an EGD with dil done. Pt working in [place] and would have trouble coming for an OV prior to procedure. Explained to pt she will need to come for a previsit and the procedure. Pt wants to know if she has to be seen for an OV and if she does could she schedule a virtual visit. Please advise.

## 2019-04-14 NOTE — Telephone Encounter (Signed)
Given her long history of GERD, known Schatzki's ring I am okay if we bring her straight for upper endoscopy for probable dilation

## 2019-04-14 NOTE — Telephone Encounter (Signed)
Spoke with pt and she is aware. States she will call back to schedule once she is sure her insurance is in place.

## 2019-04-15 ENCOUNTER — Telehealth: Payer: Self-pay | Admitting: Internal Medicine

## 2019-04-15 ENCOUNTER — Telehealth: Payer: Self-pay

## 2019-04-15 ENCOUNTER — Other Ambulatory Visit: Payer: Self-pay

## 2019-04-15 NOTE — Telephone Encounter (Signed)
Ok, thanks for the update.

## 2019-04-15 NOTE — Telephone Encounter (Signed)
Dr. Hilarie Fredrickson this pt states her insurance ends 10/1. She is requesting to have the EGD with dil tomorrow. Are you ok with another MD doing this for her? Dr. Bryan Lemma has an opening tomorrow am if he and you are ok with this. Please advise.

## 2019-04-15 NOTE — Telephone Encounter (Signed)
Spoke with Dr. Hilarie Fredrickson and he is ok with Dr. Tracie Harrier doing procedure. Pt scheduled for tomorrow am, pt aware.

## 2019-04-15 NOTE — Telephone Encounter (Signed)
Pt came to office for previsit but when she arrived she found that we are out of network with her insurance so she cancelled the appt for the EGD with dil tomorrow.

## 2019-04-15 NOTE — Telephone Encounter (Signed)
I am okay with scheduling EGD with dilation with me tomorrow as long as Dr. Hilarie Fredrickson agrees.  Thanks.

## 2019-04-16 ENCOUNTER — Encounter: Payer: BLUE CROSS/BLUE SHIELD | Admitting: Gastroenterology
